# Patient Record
Sex: Male | Born: 1985 | Race: Black or African American | Hispanic: No | Marital: Single | Smoking: Never smoker
Health system: Southern US, Community
[De-identification: ages and names within clinical notes are randomized; demographics above are authoritative.]

## PROBLEM LIST (undated history)

## (undated) HISTORY — PX: WISDOM TOOTH EXTRACTION: SHX21

---

## 2011-07-28 ENCOUNTER — Ambulatory Visit (INDEPENDENT_AMBULATORY_CARE_PROVIDER_SITE_OTHER): Payer: BC Managed Care – PPO | Admitting: Physician Assistant

## 2011-07-28 VITALS — BP 108/67 | HR 70 | Temp 98.3°F | Resp 16 | Ht 69.0 in | Wt 238.4 lb

## 2011-07-28 DIAGNOSIS — M79609 Pain in unspecified limb: Secondary | ICD-10-CM

## 2011-07-28 DIAGNOSIS — B070 Plantar wart: Secondary | ICD-10-CM

## 2011-07-28 NOTE — Progress Notes (Signed)
  Subjective:    Patient ID: Steven Hancock, male    DOB: 01/29/1986, 26 y.o.   MRN: [id number]  HPI Steven Hancock comes in today c/o sore lesions on soles of bilateral feet for 1 month.  Lesions are spreading and are painful.  He works 3 jobs and is on his feet most of the day.  He has never had these lesions before and has not done anything to treat them now.      Review of Systems As above    Objective:   Physical Exam  Bilateral feet with multiple dark tender lesions scattered on soles of feet.  Thick callous on each lesion. No erythema.  No vesicles, pustules, drainage.  Several lesions prepped with alcohol and pared with a # 15 scalpel to reveal deeper "seeds"      Assessment & Plan:  Plantar Warts Pain foot  Home treatment reviewed. Purchase products with Salicylic Acid.  Recommend referral dermatology due to the extent of the lesions.

## 2011-07-28 NOTE — Patient Instructions (Signed)
Plantar Wart Warts are benign (noncancerous) growths of the outer skin layer. They can occur at any time in life but are most common during childhood and the teen years. Warts can occur on many skin surfaces of the body. When they occur on the underside (sole) of your foot they are called plantar warts. They often emerge in groups with several small warts encircling a larger growth. CAUSES  Human papillomavirus (HPV) is the cause of plantar warts. HPV attacks a break in the skin of the foot. Walking barefoot can lead to exposure to the wart virus. Plantar warts tend to develop over areas of pressure such as the heel and ball of the foot. Plantar warts often grow into the deeper layers of skin. They may spread to other areas of the sole but cannot spread to other areas of the body. SYMPTOMS  You may also notice a growth on the undersurface of your foot. The wart may grow directly into the sole of the foot, or rise above the surface of the skin on the sole of the foot, or both. They are most often flat from pressure. Warts generally do not cause itching but may cause pain in the area of the wart when you put weight on your foot. DIAGNOSIS  Diagnosis is made by physical examination. This means your caregiver discovers it while examining your foot.  TREATMENT  There are many ways to treat plantar warts. However, warts are very tough. Sometimes it is difficult to treat them so that they go away completely and do not grow back. Any treatment must be done regularly to work. If left untreated, most plantar warts will eventually disappear over a period of one to two years. Treatments you can do at home include:  Putting duct tape over the top of the wart (occlusion), has been found to be effective over several months. The duct tape should be removed each night and reapplied until the wart has disappeared.   Placing over-the-counter medications on top of the wart to help kill the wart virus and remove the wart  tissue (salicylic acid, cantharidin, and dichloroacetic acid ) are useful. These are called keratolytic agents. These medications make the skin soft and gradually layers will shed away. Theses compounds are usually placed on the wart each night and then covered with a band-aid. They are also available in pre-medicated band-aid form. Avoid surrounding skin when applying these liquids as these medications can burn healthy skin. The treatment may take several months of nightly use to be effective.   Cryotherapy to freeze the wart has recently become available over-the-counter for children 4 years and older. This system makes use of a soft narrow applicator connected to a bottle of compressed cold liquid that is applied directly to the wart. This medication can burn health skin and should be used with caution.   As with all over-the-counter medications, read the directions carefully before use.  Treatments generally done in your caregiver's office include:  Some aggressive treatments may cause discomfort, discoloration and scaring of the surrounding skin. The risks and benefits of treatment should be discussed with your caregiver.   Freezing the wart with liquid nitrogen (cryotherapy, see above).   Burning the wart with use of very high heat (cautery).   Injecting medication into the wart.   Surgically removing or laser treatment of the wart.   Your caregiver may refer you to a dermatologist for difficult to treat, large sized or large numbers of warts.  HOME CARE INSTRUCTIONS  Soak the affected area in warm water. Dry the area completely when you are done. Remove the top layer of softened skin, then apply the chosen topical medication and reapply a bandage.   Remove the bandage daily and file excess wart tissue (pumice stone works well for this purpose). Repeat the entire process daily or every other day for weeks until the plantar wart disappears.   Several brands of salicylic acid pads are  available as over-the-counter remedies.   Pain can be relieved by wearing a doughnut bandage. This is a bandage with a hole in it. The bandage is put on with the hole over the wart. This helps take the pressure off the wart and gives pain relief.  To help prevent plantar warts:  Wear shoes and socks and change them daily.   Keep feet clean and dry.   Check your feet and your children's feet regularly.   Avoid direct contact with warts on other people.   Have growths, or changes on your skin checked by your caregiver.  Document Released: 06/28/2003 Document Revised: 03/27/2011 Document Reviewed: 12/06/2008 [facility] Patient Information 2012 [place], [place].     Buy any over the counter product with Salicylic Acid for at home treatment. You can buy corn pads to help relieve pressure off warts. Dermatology appointment will be necessary.  You can call [facility], Dr. Jorja Loa, or Dr. Emily Filbert to make your own appointment.

## 2015-03-10 ENCOUNTER — Emergency Department (INDEPENDENT_AMBULATORY_CARE_PROVIDER_SITE_OTHER): Payer: BLUE CROSS/BLUE SHIELD

## 2015-03-10 ENCOUNTER — Emergency Department (HOSPITAL_COMMUNITY)
Admission: EM | Admit: 2015-03-10 | Discharge: 2015-03-10 | Disposition: A | Discharge status: Home / Self Care | Payer: BLUE CROSS/BLUE SHIELD | Source: Home / Self Care | Attending: Emergency Medicine | Admitting: Emergency Medicine

## 2015-03-10 ENCOUNTER — Encounter (HOSPITAL_COMMUNITY): Payer: Self-pay | Admitting: Emergency Medicine

## 2015-03-10 DIAGNOSIS — J40 Bronchitis, not specified as acute or chronic: Secondary | ICD-10-CM | POA: Diagnosis not present

## 2015-03-10 MED ORDER — PREDNISONE 50 MG PO TABS: ORAL_TABLET | ORAL | Status: DC

## 2015-03-10 MED ORDER — HYDROCODONE-HOMATROPINE 5-1.5 MG/5ML PO SYRP: 5.0000 mL | ORAL_SOLUTION | Freq: Four times a day (QID) | ORAL | Status: DC | PRN

## 2015-03-10 MED ORDER — AZITHROMYCIN 250 MG PO TABS: ORAL_TABLET | ORAL | Status: DC

## 2015-03-10 NOTE — ED Notes (Signed)
The [facility] with a complaint of a cough and congestion that has been ongoing for 1 month.

## 2015-03-10 NOTE — Discharge Instructions (Signed)
You have bronchitis. Take azithromycin and prednisone as prescribed. Use hycodan as needed for cough.  Do not take this medicine while driving. You should see improvement in the next 3-5 days. If you develop fevers, difficulty breathing, or are just not getting better, please come back or go to the emergency room.

## 2015-03-10 NOTE — ED Provider Notes (Signed)
CSN: [id number]     Arrival date & time 03/10/15  1725 History   First MD Initiated Contact with Patient 03/10/15 1800     Chief Complaint  Patient presents with  . Cough   (Consider location/radiation/quality/duration/timing/severity/associated sxs/prior Treatment) HPI  He is a 29 year old man here for evaluation of cough. He states he has had a productive cough for the last several weeks. It is associated with nasal congestion and rhinorrhea. He also reports a mild sore throat. He reports intermittently feeling short of breath. He reports slightly decreased stamina. He denies any fevers or chills. He states he had some chest pain initially, but this has resolved. His daughter had similar symptoms and was diagnosed with walking pneumonia earlier this week. He has been taking DayQuil and NyQuil with some improvement of symptoms.  History reviewed. No pertinent past medical history. History reviewed. No pertinent past surgical history. History reviewed. No pertinent family history. Social History  Substance Use Topics  . Smoking status: Never Smoker   . Smokeless tobacco: None  . Alcohol Use: Yes     Comment: not often    Review of Systems As in history of present illness Allergies  Review of patient's allergies indicates no known allergies.  Home Medications   Prior to Admission medications   Medication Sig Start Date End Date Taking? Authorizing Provider  azithromycin (ZITHROMAX Z-PAK) 250 MG tablet Take 2 pills today, then 1 pill daily until gone. 03/10/15   Charm RingsErin J Teon Hudnall, MD  HYDROcodone-homatropine (HYCODAN) 5-1.5 MG/5ML syrup Take 5 mLs by mouth every 6 (six) hours as needed for cough. 03/10/15   Charm RingsErin J Gracelee Stemmler, MD  predniSONE (DELTASONE) 50 MG tablet Take 1 pill daily for 5 days. 03/10/15   Charm RingsErin J Lennix Kneisel, MD   Meds Ordered and Administered this Visit  Medications - No data to display  BP 116/70 mmHg  Pulse 74  Temp(Src) 98.8 F (37.1 C) (Oral)  Resp 20  SpO2 98% No data  found.   Physical Exam  Constitutional: He is oriented to person, place, and time. He appears well-developed and well-nourished. No distress.  HENT:  Nose: Nose normal.  Mouth/Throat: No oropharyngeal exudate.  Mild postnasal drainage  Neck: Neck supple.  Cardiovascular: Normal rate, regular rhythm and normal heart sounds.   No murmur heard. Pulmonary/Chest: Effort normal and breath sounds normal. No respiratory distress. He has no wheezes. He has no rales.  Lymphadenopathy:    He has no cervical adenopathy.  Neurological: He is oriented to person, place, and time.    ED Course  Procedures (including critical care time)  Labs Review Labs Reviewed - No data to display  Imaging Review Dg Chest 2 View  03/10/2015  CLINICAL DATA:  Patient with cough for multiple weeks. EXAM: CHEST  2 VIEW COMPARISON:  None. FINDINGS: Normal cardiac and mediastinal contours. No consolidative pulmonary opacities. No pleural effusion or pneumothorax. Regional skeleton is unremarkable. IMPRESSION: No active cardiopulmonary disease. Electronically Signed   By: Annia Beltrew  Davis M.D.   On: 03/10/2015 18:56      MDM   1. Bronchitis    Treatment prednisone, azithromycin, and Hycodan. Return precautions reviewed.    Charm RingsErin J Caleigha Zale, MD 03/10/15 [phone]

## 2015-04-07 ENCOUNTER — Emergency Department (HOSPITAL_COMMUNITY)
Admission: EM | Admit: 2015-04-07 | Discharge: 2015-04-07 | Disposition: A | Discharge status: Home / Self Care | Payer: BLUE CROSS/BLUE SHIELD | Source: Home / Self Care | Attending: Emergency Medicine | Admitting: Emergency Medicine

## 2015-04-07 ENCOUNTER — Encounter (HOSPITAL_COMMUNITY): Payer: Self-pay | Admitting: Emergency Medicine

## 2015-04-07 DIAGNOSIS — J069 Acute upper respiratory infection, unspecified: Secondary | ICD-10-CM | POA: Diagnosis not present

## 2015-04-07 DIAGNOSIS — J9801 Acute bronchospasm: Secondary | ICD-10-CM | POA: Diagnosis not present

## 2015-04-07 MED ORDER — ALBUTEROL SULFATE HFA 108 (90 BASE) MCG/ACT IN AERS: 2.0000 | INHALATION_SPRAY | RESPIRATORY_TRACT | Status: DC | PRN

## 2015-04-07 MED ORDER — PREDNISONE 20 MG PO TABS: ORAL_TABLET | ORAL | Status: DC

## 2015-04-07 NOTE — Discharge Instructions (Signed)
Bronchospasm, Adult Albuterol inhaler 2 puffs every 4 hours as needed for cough and wheeze Prednisone taper dose, take with food. A bronchospasm is a spasm or tightening of the airways going into the lungs. During a bronchospasm breathing becomes more difficult because the airways get smaller. When this happens there can be coughing, a whistling sound when breathing (wheezing), and difficulty breathing. Bronchospasm is often associated with asthma, but not all patients who experience a bronchospasm have asthma. CAUSES  A bronchospasm is caused by inflammation or irritation of the airways. The inflammation or irritation may be triggered by:   Allergies (such as to animals, pollen, food, or mold). Allergens that cause bronchospasm may cause wheezing immediately after exposure or many hours later.   Infection. Viral infections are believed to be the most common cause of bronchospasm.   Exercise.   Irritants (such as pollution, cigarette smoke, strong odors, aerosol sprays, and paint fumes).   Weather changes. Winds increase molds and pollens in the air. Rain refreshes the air by washing irritants out. Cold air may cause inflammation.   Stress and emotional upset.  SIGNS AND SYMPTOMS   Wheezing.   Excessive nighttime coughing.   Frequent or severe coughing with a simple cold.   Chest tightness.   Shortness of breath.  DIAGNOSIS  Bronchospasm is usually diagnosed through a history and physical exam. Tests, such as chest X-rays, are sometimes done to look for other conditions. TREATMENT   Inhaled medicines can be given to open up your airways and help you breathe. The medicines can be given using either an inhaler or a nebulizer machine.  Corticosteroid medicines may be given for severe bronchospasm, usually when it is associated with asthma. HOME CARE INSTRUCTIONS   Always have a plan prepared for seeking medical care. Know when to call your health care provider and local  emergency services (911 in the [place] Know where you can access local emergency care.  Only take medicines as directed by your health care provider.  If you were prescribed an inhaler or nebulizer machine, ask your health care provider to explain how to use it correctly. Always use a spacer with your inhaler if you were given one.  It is necessary to remain calm during an attack. Try to relax and breathe more slowly.  Control your home environment in the following ways:   Change your heating and air conditioning filter at least once a month.   Limit your use of fireplaces and wood stoves.  Do not smoke and do not allow smoking in your home.   Avoid exposure to perfumes and fragrances.   Get rid of pests (such as roaches and mice) and their droppings.   Throw away plants if you see mold on them.   Keep your house clean and dust free.   Replace carpet with wood, tile, or vinyl flooring. Carpet can trap dander and dust.   Use allergy-proof pillows, mattress covers, and box spring covers.   Wash bed sheets and blankets every week in hot water and dry them in a dryer.   Use blankets that are made of polyester or cotton.   Wash hands frequently. SEEK MEDICAL CARE IF:   You have muscle aches.   You have chest pain.   The sputum changes from clear or white to yellow, green, gray, or bloody.   The sputum you cough up gets thicker.   There are problems that may be related to the medicine you are given, such as a rash,  itching, swelling, or trouble breathing.  SEEK IMMEDIATE MEDICAL CARE IF:   You have worsening wheezing and coughing even after taking your prescribed medicines.   You have increased difficulty breathing.   You develop severe chest pain. MAKE SURE YOU:   Understand these instructions.  Will watch your condition.  Will get help right away if you are not doing well or get worse.   This information is not intended to replace advice given  to you by your health care provider. Make sure you discuss any questions you have with your health care provider.   Document Released: 04/10/2003 Document Revised: 04/28/2014 Document Reviewed: 09/27/2012 Elsevier Interactive Patient Education 2016 [employer].  How to Use an Inhaler Using your inhaler correctly is very important. Good technique will make sure that the medicine reaches your lungs.  HOW TO USE AN INHALER:  Take the cap off the inhaler.  If this is the first time using your inhaler, you need to prime it. Shake the inhaler for 5 seconds. Release four puffs into the air, away from your face. Ask your doctor for help if you have questions.  Shake the inhaler for 5 seconds.  Turn the inhaler so the bottle is above the mouthpiece.  Put your pointer finger on top of the bottle. Your thumb holds the bottom of the inhaler.  Open your mouth.  Either hold the inhaler away from your mouth (the width of 2 fingers) or place your lips tightly around the mouthpiece. Ask your doctor which way to use your inhaler.  Breathe out as much air as possible.  Breathe in and push down on the bottle 1 time to release the medicine. You will feel the medicine go in your mouth and throat.  Continue to take a deep breath in very slowly. Try to fill your lungs.  After you have breathed in completely, hold your breath for 10 seconds. This will help the medicine to settle in your lungs. If you cannot hold your breath for 10 seconds, hold it for as long as you can before you breathe out.  Breathe out slowly, through pursed lips. Whistling is an example of pursed lips.  If your doctor has told you to take more than 1 puff, wait at least 15-30 seconds between puffs. This will help you get the best results from your medicine. Do not use the inhaler more than your doctor tells you to.  Put the cap back on the inhaler.  Follow the directions from your doctor or from the inhaler package about cleaning  the inhaler. If you use more than one inhaler, ask your doctor which inhalers to use and what order to use them in. Ask your doctor to help you figure out when you will need to refill your inhaler.  If you use a steroid inhaler, always rinse your mouth with water after your last puff, gargle and spit out the water. Do not swallow the water. GET HELP IF:  The inhaler medicine only partially helps to stop wheezing or shortness of breath.  You are having trouble using your inhaler.  You have some increase in thick spit (phlegm). GET HELP RIGHT AWAY IF:  The inhaler medicine does not help your wheezing or shortness of breath or you have tightness in your chest.  You have dizziness, headaches, or fast heart rate.  You have chills, fever, or night sweats.  You have a large increase of thick spit, or your thick spit is bloody. MAKE SURE YOU:  Understand these instructions.  Will watch your condition.  Will get help right away if you are not doing well or get worse.   This information is not intended to replace advice given to you by your health care provider. Make sure you discuss any questions you have with your health care provider.   Document Released: 01/15/2008 Document Revised: 01/26/2013 Document Reviewed: 11/04/2012 Elsevier Interactive Patient Education 2016 [employer] Inc[employer]  Upper Respiratory Infection, Adult Recommend taking Allegra or Zyrtec or Claritin for nasal drainage. Most upper respiratory infections (URIs) are a viral infection of the air passages leading to the lungs. A URI affects the nose, throat, and upper air passages. The most common type of URI is nasopharyngitis and is typically referred to as "the common cold." URIs run their course and usually go away on their own. Most of the time, a URI does not require medical attention, but sometimes a bacterial infection in the upper airways can follow a viral infection. This is called a secondary infection. Sinus and  middle ear infections are common types of secondary upper respiratory infections. Bacterial pneumonia can also complicate a URI. A URI can worsen asthma and chronic obstructive pulmonary disease (COPD). Sometimes, these complications can require emergency medical care and may be life threatening.  CAUSES Almost all URIs are caused by viruses. A virus is a type of germ and can spread from one person to another.  RISKS FACTORS You may be at risk for a URI if:   You smoke.   You have chronic heart or lung disease.  You have a weakened defense (immune) system.   You are very young or very old.   You have nasal allergies or asthma.  You work in crowded or poorly ventilated areas.  You work in health care facilities or schools. SIGNS AND SYMPTOMS  Symptoms typically develop 2-3 days after you come in contact with a cold virus. Most viral URIs last 7-10 days. However, viral URIs from the influenza virus (flu virus) can last 14-18 days and are typically more severe. Symptoms may include:   Runny or stuffy (congested) nose.   Sneezing.   Cough.   Sore throat.   Headache.   Fatigue.   Fever.   Loss of appetite.   Pain in your forehead, behind your eyes, and over your cheekbones (sinus pain).  Muscle aches.  DIAGNOSIS  Your health care provider may diagnose a URI by:  Physical exam.  Tests to check that your symptoms are not due to another condition such as:  Strep throat.  Sinusitis.  Pneumonia.  Asthma. TREATMENT  A URI goes away on its own with time. It cannot be cured with medicines, but medicines may be prescribed or recommended to relieve symptoms. Medicines may help:  Reduce your fever.  Reduce your cough.  Relieve nasal congestion. HOME CARE INSTRUCTIONS   Take medicines only as directed by your health care provider.   Gargle warm saltwater or take cough drops to comfort your throat as directed by your health care provider.  Use a warm  mist humidifier or inhale steam from a shower to increase air moisture. This may make it easier to breathe.  Drink enough fluid to keep your urine clear or pale yellow.   Eat soups and other clear broths and maintain good nutrition.   Rest as needed.   Return to work when your temperature has returned to normal or as your health care provider advises. You may need to stay home longer to  avoid infecting others. You can also use a face mask and careful hand washing to prevent spread of the virus.  Increase the usage of your inhaler if you have asthma.   Do not use any tobacco products, including cigarettes, chewing tobacco, or electronic cigarettes. If you need help quitting, ask your health care provider. PREVENTION  The best way to protect yourself from getting a cold is to practice good hygiene.   Avoid oral or hand contact with people with cold symptoms.   Wash your hands often if contact occurs.  There is no clear evidence that vitamin C, vitamin E, echinacea, or exercise reduces the chance of developing a cold. However, it is always recommended to get plenty of rest, exercise, and practice good nutrition.  SEEK MEDICAL CARE IF:   You are getting worse rather than better.   Your symptoms are not controlled by medicine.   You have chills.  You have worsening shortness of breath.  You have brown or red mucus.  You have yellow or brown nasal discharge.  You have pain in your face, especially when you bend forward.  You have a fever.  You have swollen neck glands.  You have pain while swallowing.  You have white areas in the back of your throat. SEEK IMMEDIATE MEDICAL CARE IF:   You have severe or persistent:  Headache.  Ear pain.  Sinus pain.  Chest pain.  You have chronic lung disease and any of the following:  Wheezing.  Prolonged cough.  Coughing up blood.  A change in your usual mucus.  You have a stiff neck.  You have changes in  your:  Vision.  Hearing.  Thinking.  Mood. MAKE SURE YOU:   Understand these instructions.  Will watch your condition.  Will get help right away if you are not doing well or get worse.   This information is not intended to replace advice given to you by your health care provider. Make sure you discuss any questions you have with your health care provider.   Document Released: 10/01/2000 Document Revised: 08/22/2014 Document Reviewed: 07/13/2013 Elsevier Interactive Patient Education [employer] Elsevier Inc.

## 2015-04-07 NOTE — ED Provider Notes (Signed)
CSN: [id number]     Arrival date & time 04/07/15  1722 History   First MD Initiated Contact with Patient 04/07/15 1742     Chief Complaint  Patient presents with  . URI   (Consider location/radiation/quality/duration/timing/severity/associated sxs/prior Treatment) HPI Comments: 29 year old male works for [employer] is planing of head and chest congestion, mild shortness of breath, minor sore throat and PND. Denies fever but sometimes when working hard he feels hot.   History reviewed. No pertinent past medical history. History reviewed. No pertinent past surgical history. No family history on file. Social History  Substance Use Topics  . Smoking status: Never Smoker   . Smokeless tobacco: None  . Alcohol Use: Yes     Comment: not often    Review of Systems  Constitutional: Positive for activity change. Negative for fever, diaphoresis and fatigue.  HENT: Positive for congestion, postnasal drip and sore throat. Negative for ear pain, facial swelling, rhinorrhea and trouble swallowing.   Eyes: Negative for pain, discharge and redness.  Respiratory: Positive for cough and shortness of breath. Negative for chest tightness.   Cardiovascular: Negative.   Gastrointestinal: Negative.   Musculoskeletal: Negative.  Negative for neck pain and neck stiffness.  Neurological: Negative.     Allergies  Review of patient's allergies indicates no known allergies.  Home Medications   Prior to Admission medications   Medication Sig Start Date End Date Taking? Authorizing Provider  albuterol (PROVENTIL HFA;VENTOLIN HFA) 108 (90 BASE) MCG/ACT inhaler Inhale 2 puffs into the lungs every 4 (four) hours as needed for wheezing or shortness of breath. 04/07/15   Hayden Rasmussenavid Stavroula Rohde, NP  azithromycin (ZITHROMAX Z-PAK) 250 MG tablet Take 2 pills today, then 1 pill daily until gone. 03/10/15   Charm RingsErin J Honig, MD  HYDROcodone-homatropine (HYCODAN) 5-1.5 MG/5ML syrup Take 5 mLs by mouth every 6 (six) hours as needed for  cough. 03/10/15   Charm RingsErin J Honig, MD  predniSONE (DELTASONE) 20 MG tablet Take 3 tabs po on first day, 2 tabs second day, 2 tabs third day, 1 tab fourth day, 1 tab 5th day. Take with food. 04/07/15   Hayden Rasmussenavid Ingra Rother, NP   Meds Ordered and Administered this Visit  Medications - No data to display  BP 127/80 mmHg  Pulse 92  Temp(Src) 99.3 F (37.4 C) (Oral)  Resp 18  SpO2 96% No data found.   Physical Exam  Constitutional: He appears well-developed and well-nourished. No distress.  HENT:  Mouth/Throat: No oropharyngeal exudate.  Bilateral TMs benign observed due to cerumen obstruction. Oropharynx with minor erythema and scant clear PND. No exudates or swelling.  Eyes: Conjunctivae and EOM are normal.  Neck: Normal range of motion. Neck supple.  Cardiovascular: Normal rate, regular rhythm and normal heart sounds.   Pulmonary/Chest: Effort normal. He has wheezes. He has no rales.  Inspiration normal. Expiration with scattered bilateral wheezing. No crackles. Good air movement. Forced cough elicits mild coarseness bilaterally.  Musculoskeletal: He exhibits no edema.  Lymphadenopathy:    He has no cervical adenopathy.  Neurological: He is alert. He exhibits normal muscle tone.  Skin: Skin is warm and dry. No rash noted.  Nursing note and vitals reviewed.   ED Course  Procedures (including critical care time)  Labs Review Labs Reviewed - No data to display  Imaging Review No results found.   Visual Acuity Review  Right Eye Distance:   Left Eye Distance:   Bilateral Distance:    Right Eye Near:   Left Eye Near:  Bilateral Near:         MDM   1. URI (upper respiratory infection)   2. Cough due to bronchospasm    Recommend taking Allegra or Zyrtec or Claritin for nasal drainage. Albuterol as dir Prednisone taper as dir.  Tylenol prn    Hayden Rasmussen, NP 04/07/15 [phone]

## 2015-04-07 NOTE — ED Notes (Signed)
C/o cold sx onset today Sx include: SOB, wheezing, runny nose, congestion, prod cough Denies fevers A&O x4... No acute distress.

## 2017-08-29 IMAGING — DX DG CHEST 2V
2 series · 2 of 2 positions shown · non-contrast
Comparison: None.

CLINICAL DATA: Patient with cough for multiple weeks.

EXAM:
CHEST  2 VIEW

[chest pa]
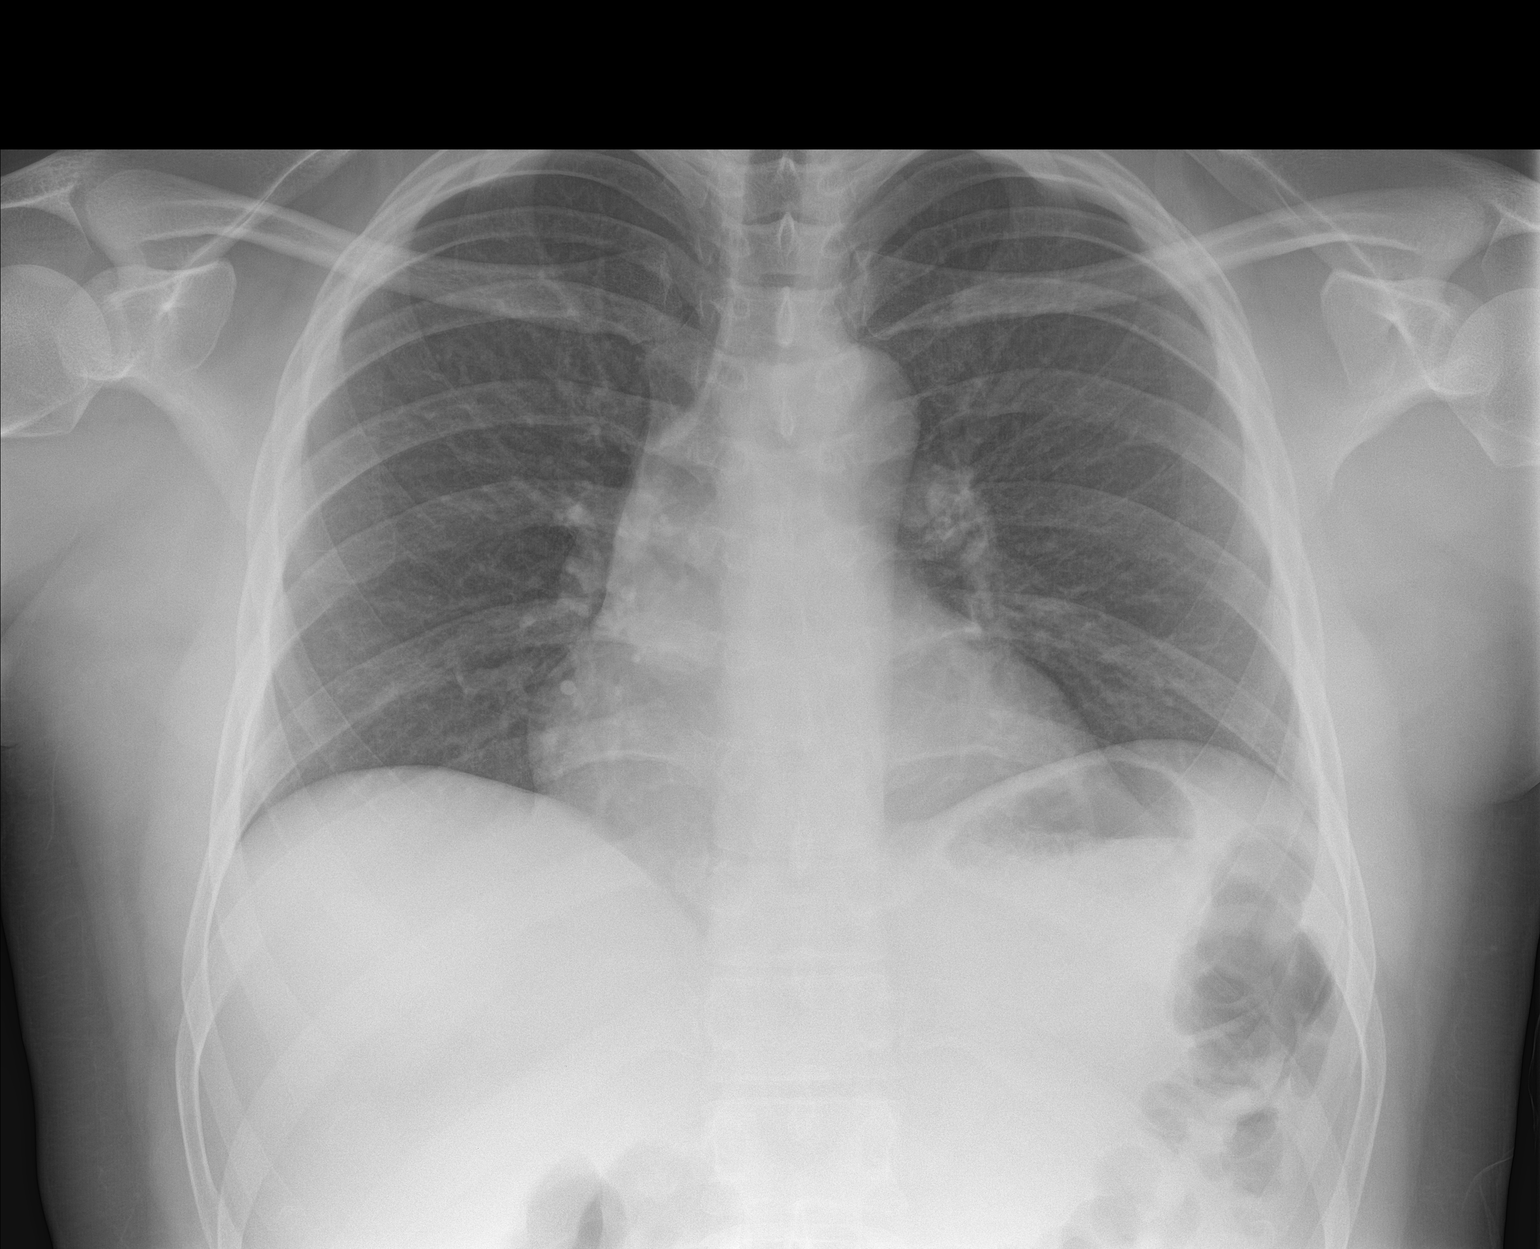

[chest lat]
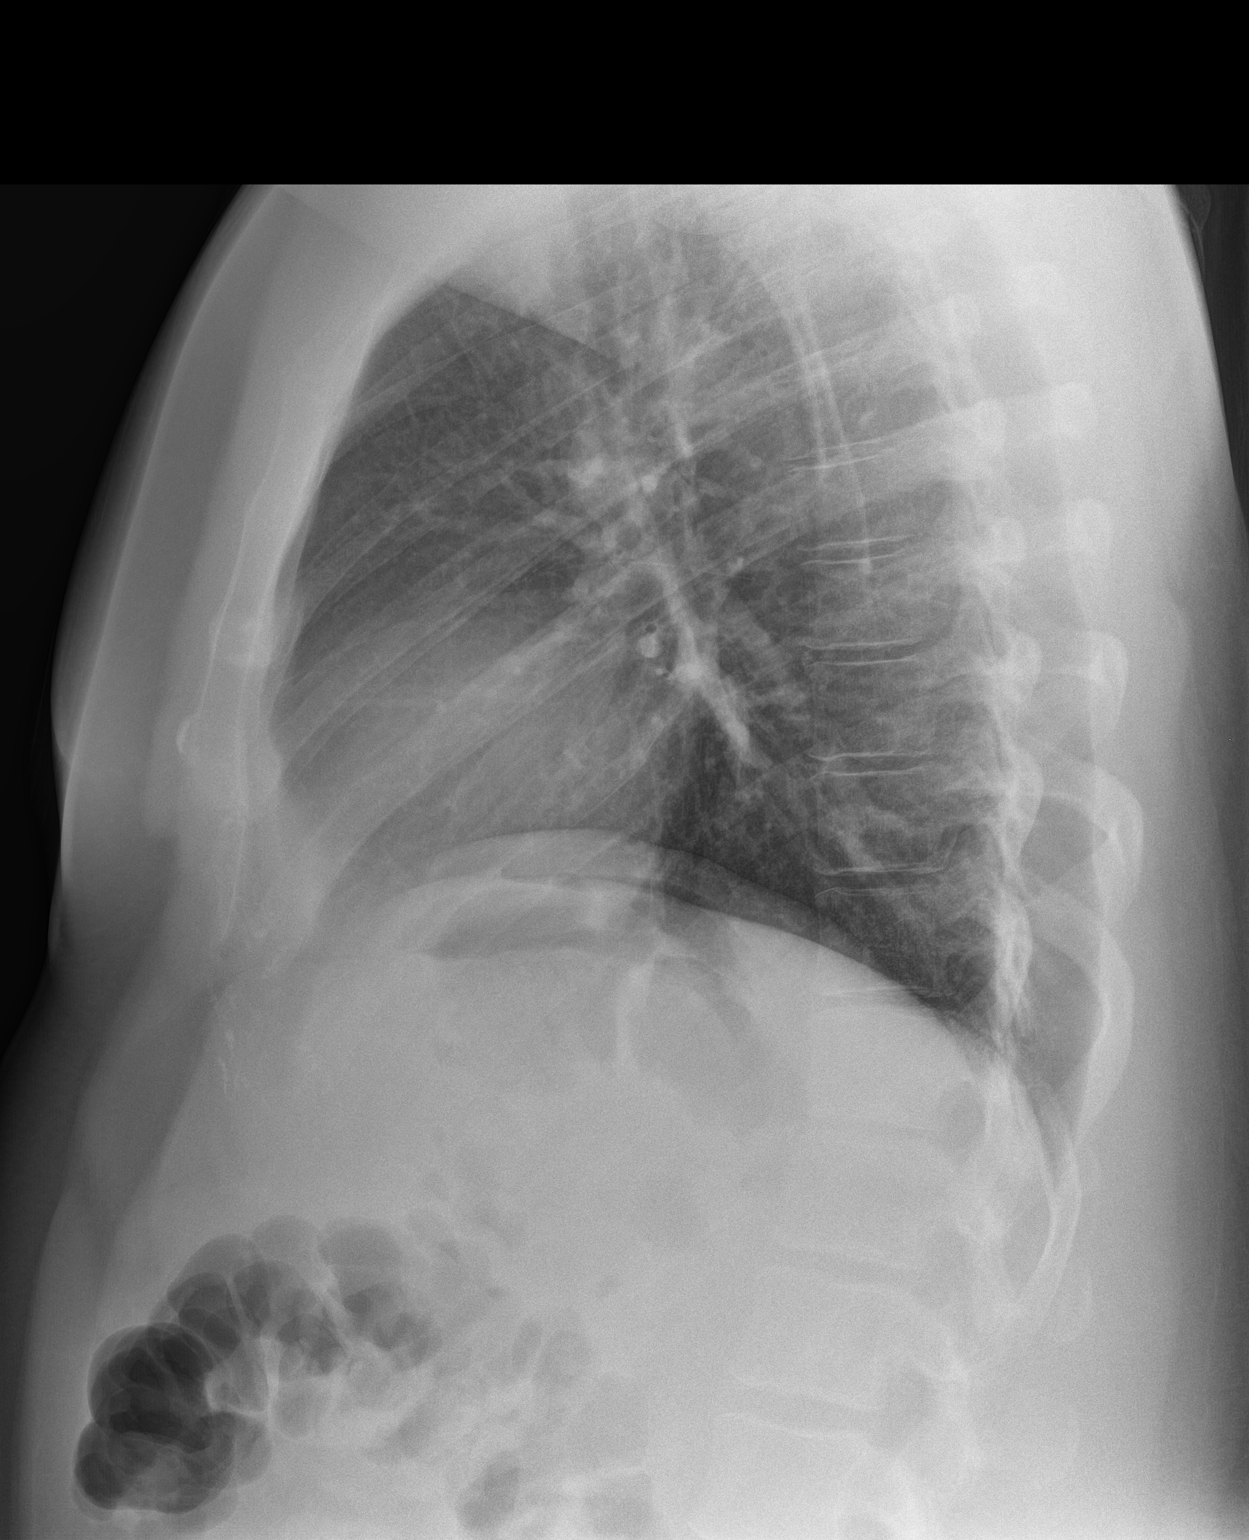

[2 of 2 positions shown; findings below may reference images not displayed]

FINDINGS: Normal cardiac and mediastinal contours. No consolidative pulmonary
opacities. No pleural effusion or pneumothorax. Regional skeleton is
unremarkable.
IMPRESSION: No active cardiopulmonary disease.

## 2019-11-14 ENCOUNTER — Encounter: Payer: Self-pay | Admitting: Nurse Practitioner

## 2019-11-14 ENCOUNTER — Other Ambulatory Visit: Payer: Self-pay

## 2019-11-14 ENCOUNTER — Ambulatory Visit (INDEPENDENT_AMBULATORY_CARE_PROVIDER_SITE_OTHER): Payer: BC Managed Care – PPO | Admitting: Nurse Practitioner

## 2019-11-14 VITALS — BP 124/81 | HR 85 | Temp 98.5°F | Resp 16 | Ht 69.0 in | Wt 229.0 lb

## 2019-11-14 DIAGNOSIS — E669 Obesity, unspecified: Secondary | ICD-10-CM

## 2019-11-14 DIAGNOSIS — J4520 Mild intermittent asthma, uncomplicated: Secondary | ICD-10-CM

## 2019-11-14 MED ORDER — ALBUTEROL SULFATE HFA 108 (90 BASE) MCG/ACT IN AERS: 2.0000 | INHALATION_SPRAY | Freq: Four times a day (QID) | RESPIRATORY_TRACT | 2 refills | Status: DC | PRN

## 2019-11-14 NOTE — Patient Instructions (Signed)
Earwax Buildup, Adult The ears produce a substance called earwax that helps keep bacteria out of the ear and protects the skin in the ear canal. Occasionally, earwax can build up in the ear and cause discomfort or hearing loss. What increases the risk? This condition is more likely to develop in people who:  Are male.  Are elderly.  Naturally produce more earwax.  Clean their ears often with cotton swabs.  Use earplugs often.  Use in-ear headphones often.  Wear hearing aids.  Have narrow ear canals.  Have earwax that is overly thick or sticky.  Have eczema.  Are dehydrated.  Have excess hair in the ear canal. What are the signs or symptoms? Symptoms of this condition include:  Reduced or muffled hearing.  A feeling of fullness in the ear or feeling that the ear is plugged.  Fluid coming from the ear.  Ear pain.  Ear itch.  Ringing in the ear.  Coughing.  An obvious piece of earwax that can be seen inside the ear canal. How is this diagnosed? This condition may be diagnosed based on:  Your symptoms.  Your medical history.  An ear exam. During the exam, your health care provider will look into your ear with an instrument called an otoscope. You may have tests, including a hearing test. How is this treated? This condition may be treated by:  Using ear drops to soften the earwax.  Having the earwax removed by a health care provider. The health care provider may: ? Flush the ear with water. ? Use an instrument that has a loop on the end (curette). ? Use a suction device.  Surgery to remove the wax buildup. This may be done in severe cases. Follow these instructions at home:   Take over-the-counter and prescription medicines only as told by your health care provider.  Do not put any objects, including cotton swabs, into your ear. You can clean the opening of your ear canal with a washcloth or facial tissue.  Follow instructions from your health care  provider about cleaning your ears. Do not over-clean your ears.  Drink enough fluid to keep your urine clear or pale yellow. This will help to thin the earwax.  Keep all follow-up visits as told by your health care provider. If earwax builds up in your ears often or if you use hearing aids, consider seeing your health care provider for routine, preventive ear cleanings. Ask your health care provider how often you should schedule your cleanings.  If you have hearing aids, clean them according to instructions from the manufacturer and your health care provider. Contact a health care provider if:  You have ear pain.  You develop a fever.  You have blood, pus, or other fluid coming from your ear.  You have hearing loss.  You have ringing in your ears that does not go away.  Your symptoms do not improve with treatment.  You feel like the room is spinning (vertigo). Summary  Earwax can build up in the ear and cause discomfort or hearing loss.  The most common symptoms of this condition include reduced or muffled hearing and a feeling of fullness in the ear or feeling that the ear is plugged.  This condition may be diagnosed based on your symptoms, your medical history, and an ear exam.  This condition may be treated by using ear drops to soften the earwax or by having the earwax removed by a health care provider.  Do not put any   objects, including cotton swabs, into your ear. You can clean the opening of your ear canal with a washcloth or facial tissue. This information is not intended to replace advice given to you by your health care provider. Make sure you discuss any questions you have with your health care provider. Document Revised: 03/20/2017 Document Reviewed: 06/18/2016 Elsevier Patient Education  2020 Elsevier Inc.  

## 2019-11-14 NOTE — Progress Notes (Signed)
[facility] [address] [place], [place]  [address] Phone:  [phone]   Fax:  [phone]    New Patient Office Visit  Subjective:  Patient ID: Steven Hancock, male    DOB: 07-16-1985  Age: 34 y.o. MRN: [id number]  CC:  Chief Complaint  Patient presents with  . Establish Care    general check up     HPI  Steven Hancock presents to establish care.  He has a remote history of asthma.  He admits that he also receives allergy shots as a child.  He is currently working full-time at [employer] and does part-time work. Current symptoms include wheezing. Symptoms have been present since several months ago and have been stable. He denies chest pain, chest tightness, dyspnea and productive cough. Associated symptoms include none.  This episode appears to have been triggered by dust. No treatments has been tried for the current symptoms.  The patient has been having similar episodes for approximately As a child  History reviewed. No pertinent past medical history.  Past Surgical History:  Procedure Laterality Date  . WISDOM TOOTH EXTRACTION      Family History  Problem Relation Age of Onset  . Hypertension Mother   . Heart disease Father   . Hypertension Maternal Grandfather   . Heart disease Maternal Grandfather     Social History   Socioeconomic History  . Marital status: Single    Spouse name: Not on file  . Number of children: Not on file  . Years of education: Not on file  . Highest education level: Not on file  Occupational History  . Not on file  Tobacco Use  . Smoking status: Never Smoker  . Smokeless tobacco: Never Used  Vaping Use  . Vaping Use: Never used  Substance and Sexual Activity  . Alcohol use: Yes    Comment: not often  . Drug use: Yes    Types: Marijuana    Comment: daily  . Sexual activity: Yes  Other Topics Concern  . Not on file  Social History Narrative  . Not on file   Social Determinants of Health   Financial  Resource Strain:   . Difficulty of Paying Living Expenses:   Food Insecurity:   . Worried About Programme researcher, broadcasting/film/video in the Last Year:   . Barista in the Last Year:   Transportation Needs:   . Freight forwarder (Medical):   Marland Kitchen Lack of Transportation (Non-Medical):   Physical Activity:   . Days of Exercise per Week:   . Minutes of Exercise per Session:   Stress:   . Feeling of Stress :   Social Connections:   . Frequency of Communication with Friends and Family:   . Frequency of Social Gatherings with Friends and Family:   . Attends Religious Services:   . Active Member of Clubs or Organizations:   . Attends Banker Meetings:   Marland Kitchen Marital Status:   Intimate Partner Violence:   . Fear of Current or Ex-Partner:   . Emotionally Abused:   Marland Kitchen Physically Abused:   . Sexually Abused:     ROS Review of Systems  Constitutional: Negative for unexpected weight change.       Changed diet Busy at [employer]   HENT: Negative.   Eyes: Negative.   Respiratory: Positive for wheezing.   Gastrointestinal: Negative.   Endocrine: Negative.   Genitourinary: Negative.   Musculoskeletal: Negative.   Skin:  Negative.   Allergic/Immunologic: Negative.   Neurological: Negative.   Hematological: Negative.   Psychiatric/Behavioral: Negative.   All other systems reviewed and are negative.   Objective:   Today's Vitals: BP 124/81 (BP Location: Left Arm, Patient Position: Sitting, Cuff Size: Large)   Pulse 85   Temp 98.5 F (36.9 C) (Oral)   Resp 16   Ht 5\' 9"  (1.753 m)   Wt (!) 229 lb (103.9 kg)   SpO2 100%   BMI 33.82 kg/m   Physical Exam Vitals reviewed.  Constitutional:      Appearance: He is obese.  HENT:     Head: Normocephalic and atraumatic.     Right Ear: There is impacted cerumen.     Left Ear: There is impacted cerumen.     Nose: Nose normal.     Mouth/Throat:     Mouth: Mucous membranes are moist.  Eyes:     Pupils: Pupils are equal, round, and  reactive to light.  Cardiovascular:     Rate and Rhythm: Normal rate and regular rhythm.     Pulses: Normal pulses.     Heart sounds: Normal heart sounds.  Pulmonary:     Effort: Pulmonary effort is normal.  Abdominal:     General: Bowel sounds are normal.     Palpations: Abdomen is soft.  Musculoskeletal:        General: Normal range of motion.     Cervical back: Normal range of motion.  Skin:    General: Skin is warm and dry.     Capillary Refill: Capillary refill takes less than 2 seconds.  Neurological:     General: No focal deficit present.     Mental Status: He is alert and oriented to person, place, and time.  Psychiatric:        Mood and Affect: Mood normal.        Behavior: Behavior normal.        Thought Content: Thought content normal.        Judgment: Judgment normal.     Assessment & Plan:   Problem List Items Addressed This Visit    None    Visit Diagnoses    Mild intermittent asthma, unspecified whether complicated    -  Primary   Relevant Medications Use while working however encouraged him follow up if he is having to use it more than 3 days   albuterol (VENTOLIN HFA) 108 (90 Base) MCG/ACT inhaler   Obesity (BMI 30.0-34.9)     Encouraged to continue lifestyle  Modification and to add planned 30 minutes daily      Outpatient Encounter Medications as of 11/14/2019  Medication Sig  . Multiple Vitamin (MULTIVITAMIN) tablet Take 1 tablet by mouth daily.  11/16/2019 albuterol (VENTOLIN HFA) 108 (90 Base) MCG/ACT inhaler Inhale 2 puffs into the lungs every 6 (six) hours as needed for wheezing or shortness of breath.  . [DISCONTINUED] albuterol (PROVENTIL HFA;VENTOLIN HFA) 108 (90 BASE) MCG/ACT inhaler Inhale 2 puffs into the lungs every 4 (four) hours as needed for wheezing or shortness of breath.  . [DISCONTINUED] azithromycin (ZITHROMAX Z-PAK) 250 MG tablet Take 2 pills today, then 1 pill daily until gone.  . [DISCONTINUED] HYDROcodone-homatropine (HYCODAN) 5-1.5  MG/5ML syrup Take 5 mLs by mouth every 6 (six) hours as needed for cough.  . [DISCONTINUED] predniSONE (DELTASONE) 20 MG tablet Take 3 tabs po on first day, 2 tabs second day, 2 tabs third day, 1 tab fourth day, 1 tab 5th day.  Take with food.   No facility-administered encounter medications on file as of 11/14/2019.    Follow-up: Return in about 6 months (around 05/16/2020).   Barbette Merino, NP

## 2020-05-16 ENCOUNTER — Encounter: Payer: Self-pay | Admitting: Nurse Practitioner

## 2020-05-16 ENCOUNTER — Other Ambulatory Visit: Payer: Self-pay

## 2020-05-16 ENCOUNTER — Ambulatory Visit (INDEPENDENT_AMBULATORY_CARE_PROVIDER_SITE_OTHER): Payer: BC Managed Care – PPO | Admitting: Nurse Practitioner

## 2020-05-16 VITALS — BP 130/80 | HR 84 | Temp 98.4°F | Ht 69.0 in | Wt 217.8 lb

## 2020-05-16 DIAGNOSIS — Z1159 Encounter for screening for other viral diseases: Secondary | ICD-10-CM | POA: Diagnosis not present

## 2020-05-16 DIAGNOSIS — Z13220 Encounter for screening for lipoid disorders: Secondary | ICD-10-CM

## 2020-05-16 DIAGNOSIS — E669 Obesity, unspecified: Secondary | ICD-10-CM | POA: Diagnosis not present

## 2020-05-16 DIAGNOSIS — J4520 Mild intermittent asthma, uncomplicated: Secondary | ICD-10-CM

## 2020-05-16 DIAGNOSIS — Z114 Encounter for screening for human immunodeficiency virus [HIV]: Secondary | ICD-10-CM

## 2020-05-16 NOTE — Patient Instructions (Signed)
Debrox

## 2020-05-16 NOTE — Progress Notes (Unsigned)
[facility] [address] [place], [place]  [address] Phone:  [phone]   Fax:  [phone]   Established Patient Office Visit  Subjective:  Patient ID: Steven Hancock, male    DOB: [birth year]  Age: 35 y.o. MRN: [id number]  CC:  Chief Complaint  Patient presents with  . Follow-up    6 follow up, think pull a muscle in rib cage , taking medication and it help with pain     HPI Sundiata Fortin presents for follow up. He  has no past medical history on file.   He has a history of asthma. He admits that his symptoms have been controlled.   He has only one concern that he had some rib pain on last Thursday. He feels like this was related to his sleeping position. He likes to lay on his stomach. He felt like he slept on his elbows and this my be the cause of the pain. He feels like this is improving. He denies any injuries, shortness of breath or chest pain.   History reviewed. No pertinent past medical history.  Past Surgical History:  Procedure Laterality Date  . WISDOM TOOTH EXTRACTION      Family History  Problem Relation Age of Onset  . Hypertension Mother   . Heart disease Father   . Hypertension Maternal Grandfather   . Heart disease Maternal Grandfather     Social History   Socioeconomic History  . Marital status: Single    Spouse name: Not on file  . Number of children: Not on file  . Years of education: Not on file  . Highest education level: Not on file  Occupational History  . Not on file  Tobacco Use  . Smoking status: Never Smoker  . Smokeless tobacco: Never Used  Vaping Use  . Vaping Use: Never used  Substance and Sexual Activity  . Alcohol use: Yes    Comment: not often  . Drug use: Yes    Types: Marijuana    Comment: daily  . Sexual activity: Yes  Other Topics Concern  . Not on file  Social History Narrative  . Not on file   Social Determinants of Health   Financial Resource Strain: Not on file  Food Insecurity:  Not on file  Transportation Needs: Not on file  Physical Activity: Not on file  Stress: Not on file  Social Connections: Not on file  Intimate Partner Violence: Not on file    Outpatient Medications Prior to Visit  Medication Sig Dispense Refill  . Ibuprofen (ADVIL LIQUI-GELS MINIS PO) Take by mouth.    . Multiple Vitamin (MULTIVITAMIN) tablet Take 1 tablet by mouth daily.    Marland Kitchen albuterol (VENTOLIN HFA) 108 (90 Base) MCG/ACT inhaler Inhale 2 puffs into the lungs every 6 (six) hours as needed for wheezing or shortness of breath. (Patient not taking: Reported on 05/16/2020) 8.5 g 2   No facility-administered medications prior to visit.    No Known Allergies  ROS Review of Systems    Objective:    Physical Exam HENT:     Head: Normocephalic and atraumatic.     Right Ear: There is impacted cerumen.     Left Ear: There is impacted cerumen.     Nose: Nose normal.     Mouth/Throat:     Mouth: Mucous membranes are moist.  Cardiovascular:     Rate and Rhythm: Normal rate and regular rhythm.     Pulses: Normal pulses.  Heart sounds: Normal heart sounds.  Pulmonary:     Effort: Pulmonary effort is normal.     Breath sounds: Normal breath sounds.  Abdominal:     General: Bowel sounds are normal.     Palpations: Abdomen is soft.  Musculoskeletal:        General: Normal range of motion.     Cervical back: Normal range of motion.  Skin:    General: Skin is warm and dry.     Capillary Refill: Capillary refill takes less than 2 seconds.  Neurological:     General: No focal deficit present.     Mental Status: He is alert and oriented to person, place, and time.  Psychiatric:        Mood and Affect: Mood normal.        Behavior: Behavior normal.        Thought Content: Thought content normal.        Judgment: Judgment normal.     BP 130/80 (BP Location: Left Arm, Patient Position: Sitting, Cuff Size: Normal)   Pulse 84   Temp 98.4 F (36.9 C) (Temporal)   Ht 5\' 9"  (1.753  m)   Wt 217 lb 12.8 oz (98.8 kg)   SpO2 96%   BMI 32.16 kg/m  Wt Readings from Last 3 Encounters:  05/16/20 217 lb 12.8 oz (98.8 kg)  11/14/19 (!) 229 lb (103.9 kg)  07/28/11 238 lb 6.4 oz (108.1 kg)     There are no preventive care reminders to display for this patient.  There are no preventive care reminders to display for this patient.  No results found for: TSH No results found for: WBC, HGB, HCT, MCV, PLT Lab Results  Component Value Date   NA 143 05/16/2020   K 4.8 05/16/2020   GLUCOSE 101 (H) 05/16/2020   BUN 15 05/16/2020   CREATININE 1.02 05/16/2020   BILITOT 0.8 05/16/2020   ALKPHOS 89 05/16/2020   AST 26 05/16/2020   PROT 6.5 05/16/2020   ALBUMIN 4.3 05/16/2020   CALCIUM 9.8 05/16/2020   Lab Results  Component Value Date   CHOL 174 05/16/2020   Lab Results  Component Value Date   HDL 66 05/16/2020   Lab Results  Component Value Date   LDLCALC 95 05/16/2020   Lab Results  Component Value Date   TRIG 68 05/16/2020   Lab Results  Component Value Date   CHOLHDL 2.6 05/16/2020   No results found for: HGBA1C    Assessment & Plan:   Problem List Items Addressed This Visit   None   Visit Diagnoses    Mild intermittent asthma, unspecified whether complicated    -  Primary Stable will continue with current regimen Albuterol 108 mcg 2 puffs q 6 hours prn   Obesity (BMI 30.0-34.9)     Improving Continue with lifestyle modifications with diet and exercise and weight loss.     Relevant Orders   Comp. Metabolic Panel (12) (Completed)   Encounter for hepatitis C screening test for low risk patient       Relevant Orders   Hepatitis C antibody (Completed)   Screening for HIV (human immunodeficiency virus)       Relevant Orders   HIV Antibody (routine testing w rflx) (Completed)   Screening for cholesterol level       Relevant Orders   Lipid panel (Completed)      No orders of the defined types were placed in this encounter.   Follow-up:  Return in about 1 year (around 05/16/2021).    Barbette Merino, NP

## 2020-05-17 LAB — COMP. METABOLIC PANEL (12)
AST: 26 IU/L (ref 0–40)
Albumin/Globulin Ratio: 2 (ref 1.2–2.2)
Albumin: 4.3 g/dL (ref 4.0–5.0)
Alkaline Phosphatase: 89 IU/L (ref 44–121)
BUN/Creatinine Ratio: 15 (ref 9–20)
BUN: 15 mg/dL (ref 6–20)
Bilirubin Total: 0.8 mg/dL (ref 0.0–1.2)
Calcium: 9.8 mg/dL (ref 8.7–10.2)
Chloride: 104 mmol/L (ref 96–106)
Creatinine, Ser: 1.02 mg/dL (ref 0.76–1.27)
GFR calc Af Amer: 110 mL/min/{1.73_m2} (ref >59)
GFR calc non Af Amer: 95 mL/min/{1.73_m2} (ref >59)
Globulin, Total: 2.2 g/dL (ref 1.5–4.5)
Glucose: 101 mg/dL — ABNORMAL HIGH (ref 65–99)
Potassium: 4.8 mmol/L (ref 3.5–5.2)
Sodium: 143 mmol/L (ref 134–144)
Total Protein: 6.5 g/dL (ref 6.0–8.5)

## 2020-05-17 LAB — LIPID PANEL
Chol/HDL Ratio: 2.6 ratio (ref 0.0–5.0)
Cholesterol, Total: 174 mg/dL (ref 100–199)
HDL: 66 mg/dL (ref >39)
LDL Chol Calc (NIH): 95 mg/dL (ref 0–99)
Triglycerides: 68 mg/dL (ref 0–149)
VLDL Cholesterol Cal: 13 mg/dL (ref 5–40)

## 2020-05-17 LAB — HEPATITIS C ANTIBODY: Hep C Virus Ab: <0.1 s/co ratio (ref 0.0–0.9)

## 2020-05-17 LAB — HIV ANTIBODY (ROUTINE TESTING W REFLEX): HIV Screen 4th Generation wRfx: NONREACTIVE

## 2021-05-16 ENCOUNTER — Encounter: Payer: Self-pay | Admitting: Nurse Practitioner

## 2021-05-16 ENCOUNTER — Ambulatory Visit: Payer: BC Managed Care – PPO | Admitting: Nurse Practitioner

## 2021-05-16 ENCOUNTER — Other Ambulatory Visit: Payer: Self-pay

## 2021-05-16 VITALS — BP 121/84 | HR 69 | Resp 16 | Wt 214.8 lb

## 2021-05-16 DIAGNOSIS — J4520 Mild intermittent asthma, uncomplicated: Secondary | ICD-10-CM | POA: Diagnosis not present

## 2021-05-16 DIAGNOSIS — Z8249 Family history of ischemic heart disease and other diseases of the circulatory system: Secondary | ICD-10-CM

## 2021-05-16 DIAGNOSIS — H6123 Impacted cerumen, bilateral: Secondary | ICD-10-CM

## 2021-05-16 MED ORDER — ALBUTEROL SULFATE HFA 108 (90 BASE) MCG/ACT IN AERS: 2.0000 | INHALATION_SPRAY | Freq: Four times a day (QID) | RESPIRATORY_TRACT | 2 refills | Status: DC | PRN

## 2021-05-16 NOTE — Progress Notes (Signed)
Patient states to still having a little bit if wheezing and would like to talk to the Doctor more about this issue, pt also requesting a refill on inhaler.

## 2021-05-16 NOTE — Patient Instructions (Addendum)
Asthma, Adult Asthma is a long-term (chronic) condition in which the airways get tight and narrow. The airways are the breathing passages that lead from the nose and mouth down into the lungs. A person with asthma will have times when symptoms get worse. These are called asthma attacks. They can cause coughing, whistling sounds when you breathe (wheezing), shortness of breath, and chest pain. They can make it hard to breathe. There is no cure for asthma, but medicines and lifestyle changes can help control it. There are many things that can bring on an asthma attack or make asthma symptoms worse (triggers). Common triggers include: Mold. Dust. Cigarette smoke. Cockroaches. Things that can cause allergy symptoms (allergens). These include animal skin flakes (dander) and pollen from trees or grass. Things that pollute the air. These may include household cleaners, wood smoke, smog, or chemical odors. Cold air, weather changes, and wind. Crying or laughing hard. Stress. Certain medicines or drugs. Certain foods such as dried fruit, potato chips, and grape juice. Infections, such as a cold or the flu. Certain medical conditions or diseases. Exercise or tiring activities. Asthma may be treated with medicines and by staying away from the things that cause asthma attacks. Types of medicines may include: Controller medicines. These help prevent asthma symptoms. They are usually taken every day. Fast-acting reliever or rescue medicines. These quickly relieve asthma symptoms. They are used as needed and provide short-term relief. Allergy medicines if your attacks are brought on by allergens. Medicines to help control the body's defense (immune) system. Follow these instructions at home: Avoiding triggers in your home Change your heating and air conditioning filter often. Limit your use of fireplaces and wood stoves. Get rid of pests (such as roaches and mice) and their droppings. Throw away plants  if you see mold on them. Clean your floors. Dust regularly. Use cleaning products that do not smell. Have someone vacuum when you are not home. Use a vacuum cleaner with a HEPA filter if possible. Replace carpet with wood, tile, or vinyl flooring. Carpet can trap animal skin flakes and dust. Use allergy-proof pillows, mattress covers, and box spring covers. Wash bed sheets and blankets every week in hot water. Dry them in a dryer. Keep your bedroom free of any triggers. Avoid pets and keep windows closed when things that cause allergy symptoms are in the air. Use blankets that are made of polyester or cotton. Clean bathrooms and kitchens with bleach. If possible, have someone repaint the walls in these rooms with mold-resistant paint. Keep out of the rooms that are being cleaned and painted. Wash your hands often with soap and water. If soap and water are not available, use hand sanitizer. Do not allow anyone to smoke in your home. General instructions Take over-the-counter and prescription medicines only as told by your doctor. Talk with your doctor if you have questions about how or when to take your medicines. Make note if you need to use your medicines more often than usual. Do not use any products that contain nicotine or tobacco, such as cigarettes and e-cigarettes. If you need help quitting, ask your doctor. Stay away from secondhand smoke. Avoid doing things outdoors when allergen counts are high and when air quality is low. Wear a ski mask when doing outdoor activities in the winter. The mask should cover your nose and mouth. Exercise indoors on cold days if you can. Warm up before you exercise. Take time to cool down after exercise. Use a peak flow meter as  told by your doctor. A peak flow meter is a tool that measures how well the lungs are working. Keep track of the peak flow meter's readings. Write them down. Follow your asthma action plan. This is a written plan for taking care  of your asthma and treating your attacks. Make sure you get all the shots (vaccines) that your doctor recommends. Ask your doctor about a flu shot and a pneumonia shot. Keep all follow-up visits as told by your doctor. This is important. Contact a doctor if: You have wheezing, shortness of breath, or a cough even while taking medicine to prevent attacks. The mucus you cough up (sputum) is thicker than usual. The mucus you cough up changes from clear or white to yellow, green, gray, or bloody. You have problems from the medicine you are taking, such as: A rash. Itching. Swelling. Trouble breathing. You need reliever medicines more than 2-3 times a week. Your peak flow reading is still at 50-79% of your personal best after following the action plan for 1 hour. You have a fever. Get help right away if: You seem to be worse and are not responding to medicine during an asthma attack. You are short of breath even at rest. You get short of breath when doing very little activity. You have trouble eating, drinking, or talking. You have chest pain or tightness. You have a fast heartbeat. Your lips or fingernails start to turn blue. You are light-headed or dizzy, or you faint. Your peak flow is less than 50% of your personal best. You feel too tired to breathe normally. Summary Asthma is a long-term (chronic) condition in which the airways get tight and narrow. An asthma attack can make it hard to breathe. Asthma cannot be cured, but medicines and lifestyle changes can help control it. Make sure you understand how to avoid triggers and how and when to use your medicines. This information is not intended to replace advice given to you by your health care provider. Make sure you discuss any questions you have with your health care provider. Document Revised: 07/31/2019 Document Reviewed: 08/10/2019 Elsevier Patient Education  [place] Maintenance, Male Adopting a healthy  lifestyle and getting preventive care are important in promoting health and wellness. Ask your health care provider about: The right schedule for you to have regular tests and exams. Things you can do on your own to prevent diseases and keep yourself healthy. What should I know about diet, weight, and exercise? Eat a healthy diet  Eat a diet that includes plenty of vegetables, fruits, low-fat dairy products, and lean protein. Do not eat a lot of foods that are high in solid fats, added sugars, or sodium. Maintain a healthy weight Body mass index (BMI) is a measurement that can be used to identify possible weight problems. It estimates body fat based on height and weight. Your health care provider can help determine your BMI and help you achieve or maintain a healthy weight. Get regular exercise Get regular exercise. This is one of the most important things you can do for your health. Most adults should: Exercise for at least 150 minutes each week. The exercise should increase your heart rate and make you sweat (moderate-intensity exercise). Do strengthening exercises at least twice a week. This is in addition to the moderate-intensity exercise. Spend less time sitting. Even light physical activity can be beneficial. Watch cholesterol and blood lipids Have your blood tested for lipids and cholesterol at 36 years of age,  then have this test every 5 years. You may need to have your cholesterol levels checked more often if: Your lipid or cholesterol levels are high. You are older than 36 years of age. You are at high risk for heart disease. What should I know about cancer screening? Many types of cancers can be detected early and may often be prevented. Depending on your health history and family history, you may need to have cancer screening at various ages. This may include screening for: Colorectal cancer. Prostate cancer. Skin cancer. Lung cancer. What should I know about heart disease,  diabetes, and high blood pressure? Blood pressure and heart disease High blood pressure causes heart disease and increases the risk of stroke. This is more likely to develop in people who have high blood pressure readings or are overweight. Talk with your health care provider about your target blood pressure readings. Have your blood pressure checked: Every 3-5 years if you are 35-67 years of age. Every year if you are 55 years old or older. If you are between the ages of 81 and 61 and are a current or former smoker, ask your health care provider if you should have a one-time screening for abdominal aortic aneurysm (AAA). Diabetes Have regular diabetes screenings. This checks your fasting blood sugar level. Have the screening done: Once every three years after age 20 if you are at a normal weight and have a low risk for diabetes. More often and at a younger age if you are overweight or have a high risk for diabetes. What should I know about preventing infection? Hepatitis B If you have a higher risk for hepatitis B, you should be screened for this virus. Talk with your health care provider to find out if you are at risk for hepatitis B infection. Hepatitis C Blood testing is recommended for: Everyone born from [birth year] through 1965. Anyone with known risk factors for hepatitis C. Sexually transmitted infections (STIs) You should be screened each year for STIs, including gonorrhea and chlamydia, if: You are sexually active and are younger than 36 years of age. You are older than 36 years of age and your health care provider tells you that you are at risk for this type of infection. Your sexual activity has changed since you were last screened, and you are at increased risk for chlamydia or gonorrhea. Ask your health care provider if you are at risk. Ask your health care provider about whether you are at high risk for HIV. Your health care provider may recommend a prescription medicine to help  prevent HIV infection. If you choose to take medicine to prevent HIV, you should first get tested for HIV. You should then be tested every 3 months for as long as you are taking the medicine. Follow these instructions at home: Alcohol use Do not drink alcohol if your health care provider tells you not to drink. If you drink alcohol: Limit how much you have to 0-2 drinks a day. Know how much alcohol is in your drink. In the [place] one drink equals one 12 oz bottle of beer (355 mL), one 5 oz glass of wine (148 mL), or one 1 oz glass of hard liquor (44 mL). Lifestyle Do not use any products that contain nicotine or tobacco. These products include cigarettes, chewing tobacco, and vaping devices, such as e-cigarettes. If you need help quitting, ask your health care provider. Do not use street drugs. Do not share needles. Ask your health care provider for help  if you need support or information about quitting drugs. General instructions Schedule regular health, dental, and eye exams. Stay current with your vaccines. Tell your health care provider if: You often feel depressed. You have ever been abused or do not feel safe at home. Summary Adopting a healthy lifestyle and getting preventive care are important in promoting health and wellness. Follow your health care provider's instructions about healthy diet, exercising, and getting tested or screened for diseases. Follow your health care provider's instructions on monitoring your cholesterol and blood pressure. This information is not intended to replace advice given to you by your health care provider. Make sure you discuss any questions you have with your health care provider. Document Revised: 08/27/2020 Document Reviewed: 08/27/2020 Elsevier Patient Education  [place].

## 2021-05-16 NOTE — Progress Notes (Signed)
Established Patient Office Visit  Subjective:  Patient ID: Steven Hancock, male    DOB: [birth year]  Age: 36 y.o. MRN: [id number]  CC:  Chief Complaint  Patient presents with   Asthma    HPI Steven Hancock presents for follow up. He has a stories of asthma.   Asthma Patient presents for evaluation of wheezing. The patient has been previously diagnosed with asthma. Symptoms currently include wheezing and occur less than 2x/week. Observed precipitants include: no identifiable factor. Current limitations in activity from asthma: none. Number of days of school or work missed in the last month: 0.  Does he do nebulizer treatments? no Does he use an inhaler? yes Does he use a spacer with MDIs? no Does he monitor peak flow rates? no    He has continued his lifestyle modification.  His weight has gone down approximately 3 pounds.  He does report that his father in at the age of 82, MI.  He also states that his father had pneumonia and was unaware.  He denies any other family history of cardiac disease and his paternal relatives are alive and well. He does continue to work at [employer].    No past medical history on file.  Past Surgical History:  Procedure Laterality Date   WISDOM TOOTH EXTRACTION      Family History  Problem Relation Age of Onset   Hypertension Mother    Heart disease Father    Hypertension Maternal Grandfather    Heart disease Maternal Grandfather     Social History   Socioeconomic History   Marital status: Single    Spouse name: Not on file   Number of children: Not on file   Years of education: Not on file   Highest education level: Not on file  Occupational History   Not on file  Tobacco Use   Smoking status: Never   Smokeless tobacco: Never  Vaping Use   Vaping Use: Never used  Substance and Sexual Activity   Alcohol use: Yes    Comment: not often   Drug use: Yes    Types: Marijuana    Comment: daily   Sexual activity: Yes  Other Topics Concern    Not on file  Social History Narrative   Not on file   Social Determinants of Health   Financial Resource Strain: Not on file  Food Insecurity: Not on file  Transportation Needs: Not on file  Physical Activity: Not on file  Stress: Not on file  Social Connections: Not on file  Intimate Partner Violence: Not on file    Outpatient Medications Prior to Visit  Medication Sig Dispense Refill   Multiple Vitamin (MULTIVITAMIN) tablet Take 1 tablet by mouth daily.     Ibuprofen (ADVIL LIQUI-GELS MINIS PO) Take by mouth. (Patient not taking: Reported on 05/16/2021)     albuterol (VENTOLIN HFA) 108 (90 Base) MCG/ACT inhaler Inhale 2 puffs into the lungs every 6 (six) hours as needed for wheezing or shortness of breath. (Patient not taking: Reported on 05/16/2020) 8.5 g 2   No facility-administered medications prior to visit.    No Known Allergies  ROS Review of Systems  Respiratory:  Positive for wheezing.   Cardiovascular:  Negative for chest pain.  Neurological:  Positive for headaches (occasional uses APAP and Aleve). Negative for dizziness.     Objective:    Physical Exam Constitutional:      Appearance: He is obese.  HENT:     Head: Normocephalic.  Right Ear: There is impacted cerumen.     Left Ear: There is impacted cerumen.     Nose: Nose normal.     Mouth/Throat:     Mouth: Mucous membranes are moist.  Cardiovascular:     Rate and Rhythm: Normal rate and regular rhythm.     Pulses: Normal pulses.     Heart sounds: Normal heart sounds.  Pulmonary:     Effort: Pulmonary effort is normal.     Breath sounds: Normal breath sounds.  Abdominal:     Palpations: Abdomen is soft.  Musculoskeletal:        General: Normal range of motion.     Cervical back: Normal range of motion.  Skin:    General: Skin is warm and dry.     Capillary Refill: Capillary refill takes less than 2 seconds.  Neurological:     General: No focal deficit present.     Mental Status: He is  alert and oriented to person, place, and time.  Psychiatric:        Mood and Affect: Mood normal.        Behavior: Behavior normal.        Thought Content: Thought content normal.        Judgment: Judgment normal.    BP 121/84    Pulse 69    Resp 16    Wt 214 lb 12.8 oz (97.4 kg)    SpO2 100%    BMI 31.72 kg/m  Wt Readings from Last 3 Encounters:  05/16/21 214 lb 12.8 oz (97.4 kg)  05/16/20 217 lb 12.8 oz (98.8 kg)  11/14/19 (!) 229 lb (103.9 kg)     Health Maintenance Due  Topic Date Due   COVID-19 Vaccine (1) Never done    There are no preventive care reminders to display for this patient.  No results found for: TSH No results found for: WBC, HGB, HCT, MCV, PLT Lab Results  Component Value Date   NA 143 05/16/2020   K 4.8 05/16/2020   GLUCOSE 101 (H) 05/16/2020   BUN 15 05/16/2020   CREATININE 1.02 05/16/2020   BILITOT 0.8 05/16/2020   ALKPHOS 89 05/16/2020   AST 26 05/16/2020   PROT 6.5 05/16/2020   ALBUMIN 4.3 05/16/2020   CALCIUM 9.8 05/16/2020   Lab Results  Component Value Date   CHOL 174 05/16/2020   Lab Results  Component Value Date   HDL 66 05/16/2020   Lab Results  Component Value Date   LDLCALC 95 05/16/2020   Lab Results  Component Value Date   TRIG 68 05/16/2020   Lab Results  Component Value Date   CHOLHDL 2.6 05/16/2020   No results found for: HGBA1C    Assessment & Plan:   Problem List Items Addressed This Visit       Respiratory   Mild intermittent asthma - Primary Stable  Education provided   Relevant Medications   albuterol (VENTOLIN HFA) 108 (90 Base) MCG/ACT inhaler   Other Visit Diagnoses     Bilateral impacted cerumen    Discussed treatment     Family history of MI (myocardial infarction)     Father passed at 21 yrs old Discussed risk factors.        Meds ordered this encounter  Medications   albuterol (VENTOLIN HFA) 108 (90 Base) MCG/ACT inhaler    Sig: Inhale 2 puffs into the lungs every 6 (six) hours  as needed for wheezing or shortness of breath.  Dispense:  8.5 g    Refill:  2    Order Specific Question:   Supervising Provider    Answer:   Quentin AngstJEGEDE, OLUGBEMIGA E [[id number]]    Follow-up: No follow-ups on file.    Barbette Merinorystal M Aaylah Pokorny, NP

## 2021-10-29 ENCOUNTER — Other Ambulatory Visit: Payer: Self-pay | Admitting: Family Medicine

## 2021-10-29 MED ORDER — ALBUTEROL SULFATE HFA 108 (90 BASE) MCG/ACT IN AERS: 2.0000 | INHALATION_SPRAY | Freq: Four times a day (QID) | RESPIRATORY_TRACT | 2 refills | Status: DC | PRN

## 2021-10-29 NOTE — Progress Notes (Signed)
Meds ordered this encounter  Medications   albuterol (VENTOLIN HFA) 108 (90 Base) MCG/ACT inhaler    Sig: Inhale 2 puffs into the lungs every 6 (six) hours as needed for wheezing or shortness of breath.    Dispense:  8.5 g    Refill:  2    Order Specific Question:   Supervising Provider    Answer:   Quentin Angst [id number]

## 2022-03-24 ENCOUNTER — Other Ambulatory Visit: Payer: Self-pay

## 2022-03-24 MED ORDER — ALBUTEROL SULFATE HFA 108 (90 BASE) MCG/ACT IN AERS: 2.0000 | INHALATION_SPRAY | Freq: Four times a day (QID) | RESPIRATORY_TRACT | 2 refills | Status: DC | PRN

## 2022-03-24 NOTE — Telephone Encounter (Signed)
[facility] is requesting to fill pt albuterol . Please advise . KH

## 2022-05-16 ENCOUNTER — Encounter: Payer: Self-pay | Admitting: Nurse Practitioner

## 2022-05-16 ENCOUNTER — Ambulatory Visit: Payer: BC Managed Care – PPO | Admitting: Nurse Practitioner

## 2022-05-16 ENCOUNTER — Ambulatory Visit (INDEPENDENT_AMBULATORY_CARE_PROVIDER_SITE_OTHER): Payer: BC Managed Care – PPO | Admitting: Nurse Practitioner

## 2022-05-16 VITALS — BP 113/80 | HR 78 | Temp 97.1°F | Ht 69.25 in | Wt 221.8 lb

## 2022-05-16 DIAGNOSIS — Z13220 Encounter for screening for lipoid disorders: Secondary | ICD-10-CM

## 2022-05-16 DIAGNOSIS — Z0000 Encounter for general adult medical examination without abnormal findings: Secondary | ICD-10-CM | POA: Diagnosis not present

## 2022-05-16 MED ORDER — ALBUTEROL SULFATE HFA 108 (90 BASE) MCG/ACT IN AERS: 2.0000 | INHALATION_SPRAY | Freq: Four times a day (QID) | RESPIRATORY_TRACT | 2 refills | Status: DC | PRN

## 2022-05-16 NOTE — Assessment & Plan Note (Signed)
-  CBC - Comprehensive metabolic panel - Lipid Panel  2. Lipid screening  - Lipid Panel   Follow up:  Follow up in 1 year

## 2022-05-16 NOTE — Patient Instructions (Addendum)
1. Routine adult health maintenance  - CBC - Comprehensive metabolic panel - Lipid Panel  2. Lipid screening  - Lipid Panel   Follow up:  Follow up in 1 year

## 2022-05-16 NOTE — Progress Notes (Signed)
@Patient  ID: Steven Hancock, male    DOB: 03-04-1986, 37 y.o.   MRN: [id number]  Chief Complaint  Patient presents with   Annual Exam    Referring provider: Vevelyn Francois, NP  HPI  Steven Hancock presents for follow up. He has a history of asthma.   Patient presents today for a yearly physical.  He does have a history of asthma and needs refill on inhaler.   Asthma  Symptoms currently include wheezing and occur less than 2x/week. Observed precipitants include: no identifiable factor. Current limitations in activity from asthma: none. Number of days of school or work missed in the last month: 0.   Does he do nebulizer treatments? no Does he use an inhaler? yes Does he use a spacer with MDIs? no Does he monitor peak flow rates? no      He has continued his lifestyle modification. He does report that his father in at the age of 1, MI. He denies any other family history of cardiac disease and his paternal relatives are alive and well. He does continue to work at [employer].    Denies f/c/s, n/v/d, hemoptysis, PND, leg swelling Denies chest pain or edema      No Known Allergies   There is no immunization history on file for this patient.  History reviewed. No pertinent past medical history.  Tobacco History: Social History   Tobacco Use  Smoking Status Never  Smokeless Tobacco Never   Counseling given: Not Answered   Outpatient Encounter Medications as of 05/16/2022  Medication Sig   Ibuprofen (ADVIL LIQUI-GELS MINIS PO) Take by mouth.   Multiple Vitamin (MULTIVITAMIN) tablet Take 1 tablet by mouth daily.   [DISCONTINUED] albuterol (VENTOLIN HFA) 108 (90 Base) MCG/ACT inhaler Inhale 2 puffs into the lungs every 6 (six) hours as needed for wheezing or shortness of breath.   albuterol (VENTOLIN HFA) 108 (90 Base) MCG/ACT inhaler Inhale 2 puffs into the lungs every 6 (six) hours as needed for wheezing or shortness of breath.   No facility-administered encounter  medications on file as of 05/16/2022.     Review of Systems  Review of Systems  Constitutional: Negative.   HENT: Negative.    Cardiovascular: Negative.   Gastrointestinal: Negative.   Allergic/Immunologic: Negative.   Neurological: Negative.   Psychiatric/Behavioral: Negative.         Physical Exam  BP 113/80   Pulse 78   Temp (!) 97.1 F (36.2 C) (Temporal)   Ht 5' 9.25" (1.759 m)   Wt 221 lb 12.8 oz (100.6 kg)   SpO2 98%   BMI 32.52 kg/m   Wt Readings from Last 5 Encounters:  05/16/22 221 lb 12.8 oz (100.6 kg)  05/16/21 214 lb 12.8 oz (97.4 kg)  05/16/20 217 lb 12.8 oz (98.8 kg)  11/14/19 (!) 229 lb (103.9 kg)  07/28/11 238 lb 6.4 oz (108.1 kg)     Physical Exam Vitals and nursing note reviewed.  Constitutional:      General: He is not in acute distress.    Appearance: He is well-developed.  Cardiovascular:     Rate and Rhythm: Normal rate and regular rhythm.  Pulmonary:     Effort: Pulmonary effort is normal.     Breath sounds: Normal breath sounds.  Skin:    General: Skin is warm and dry.  Neurological:     Mental Status: He is alert and oriented to person, place, and time.       Assessment & Plan:  Routine adult health maintenance - CBC - Comprehensive metabolic panel - Lipid Panel  2. Lipid screening  - Lipid Panel   Follow up:  Follow up in 1 year     Fenton Foy, NP 05/16/2022

## 2022-05-17 LAB — CBC
Hematocrit: 36.1 % — ABNORMAL LOW (ref 37.5–51.0)
Hemoglobin: 11.9 g/dL — ABNORMAL LOW (ref 13.0–17.7)
MCH: 31.7 pg (ref 26.6–33.0)
MCHC: 33 g/dL (ref 31.5–35.7)
MCV: 96 fL (ref 79–97)
Platelets: 195 10*3/uL (ref 150–450)
RBC: 3.75 x10E6/uL — ABNORMAL LOW (ref 4.14–5.80)
RDW: 11.9 % (ref 11.6–15.4)
WBC: 3.9 10*3/uL (ref 3.4–10.8)

## 2022-05-17 LAB — COMPREHENSIVE METABOLIC PANEL
ALT: 33 IU/L (ref 0–44)
AST: 27 IU/L (ref 0–40)
Albumin/Globulin Ratio: 2.3 — ABNORMAL HIGH (ref 1.2–2.2)
Albumin: 4.5 g/dL (ref 4.1–5.1)
Alkaline Phosphatase: 80 IU/L (ref 44–121)
BUN/Creatinine Ratio: 16 (ref 9–20)
BUN: 18 mg/dL (ref 6–20)
Bilirubin Total: 0.8 mg/dL (ref 0.0–1.2)
CO2: 25 mmol/L (ref 20–29)
Calcium: 9.6 mg/dL (ref 8.7–10.2)
Chloride: 102 mmol/L (ref 96–106)
Creatinine, Ser: 1.11 mg/dL (ref 0.76–1.27)
Globulin, Total: 2 g/dL (ref 1.5–4.5)
Glucose: 91 mg/dL (ref 70–99)
Potassium: 4.5 mmol/L (ref 3.5–5.2)
Sodium: 140 mmol/L (ref 134–144)
Total Protein: 6.5 g/dL (ref 6.0–8.5)
eGFR: 88 mL/min/{1.73_m2} (ref >59)

## 2022-05-17 LAB — LIPID PANEL
Chol/HDL Ratio: 2.7 ratio (ref 0.0–5.0)
Cholesterol, Total: 169 mg/dL (ref 100–199)
HDL: 62 mg/dL (ref >39)
LDL Chol Calc (NIH): 91 mg/dL (ref 0–99)
Triglycerides: 87 mg/dL (ref 0–149)
VLDL Cholesterol Cal: 16 mg/dL (ref 5–40)

## 2022-08-28 ENCOUNTER — Other Ambulatory Visit: Payer: Self-pay | Admitting: Nurse Practitioner

## 2022-08-28 NOTE — Telephone Encounter (Signed)
Please advise Kh 

## 2023-05-18 ENCOUNTER — Ambulatory Visit (INDEPENDENT_AMBULATORY_CARE_PROVIDER_SITE_OTHER): Payer: BC Managed Care – PPO | Admitting: Nurse Practitioner

## 2023-05-18 ENCOUNTER — Encounter: Payer: Self-pay | Admitting: Nurse Practitioner

## 2023-05-18 VITALS — BP 114/74 | HR 77 | Temp 97.0°F | Wt 217.8 lb

## 2023-05-18 DIAGNOSIS — Z0000 Encounter for general adult medical examination without abnormal findings: Secondary | ICD-10-CM

## 2023-05-18 DIAGNOSIS — J4520 Mild intermittent asthma, uncomplicated: Secondary | ICD-10-CM

## 2023-05-18 DIAGNOSIS — Z13220 Encounter for screening for lipoid disorders: Secondary | ICD-10-CM

## 2023-05-18 DIAGNOSIS — Z1329 Encounter for screening for other suspected endocrine disorder: Secondary | ICD-10-CM | POA: Diagnosis not present

## 2023-05-18 MED ORDER — ALBUTEROL SULFATE HFA 108 (90 BASE) MCG/ACT IN AERS: 2.0000 | INHALATION_SPRAY | Freq: Four times a day (QID) | RESPIRATORY_TRACT | 2 refills | Status: DC | PRN

## 2023-05-18 NOTE — Patient Instructions (Addendum)
1. Thyroid disorder screen (Primary)  - TSH   2. Routine adult health maintenance  - CBC - Comprehensive metabolic panel   3. Lipid screening  - Lipid Panel   4. Mild intermittent asthma, unspecified whether complicated  - albuterol (VENTOLIN HFA) 108 (90 Base) MCG/ACT inhaler; Inhale 2 puffs into the lungs every 6 (six) hours as needed for wheezing or shortness of breath.  Dispense: 8.5 g; Refill: 2   Follow up:  Follow up in 3 months

## 2023-05-18 NOTE — Progress Notes (Signed)
Subjective   Patient ID: Steven Hancock, male    DOB: [birth year], 38 y.o.   MRN: [id number]  Chief Complaint  Patient presents with   Medical Management of Chronic Issues    1 year follow up Patient stated that he just started snoring and wanted to discuss It    Referring provider: Ivonne Andrew, NP  Rogue Bussing is a 38 y.o. male with No past medical history on file.   HPI  Patient presents today for a yearly physical.  He does have a history of asthma and needs refill on inhaler.   Asthma   Symptoms currently include wheezing and occur less than 2x/week. Observed precipitants include: no identifiable factor. Current limitations in activity from asthma: none. Number of days of school or work missed in the last month: 0.   Does he do nebulizer treatments? no Does he use an inhaler? yes Does he use a spacer with MDIs? no Does he monitor peak flow rates? no      He has continued his lifestyle modification. He does report that his father in at the age of 43, MI. He denies any other family history of cardiac disease and his paternal relatives are alive and well. He does continue to work at [employer] and [employer].  Has noticed some minor snoring. Thinks this could be due to asthma. We discussed possible sleep study if symptoms worsen. Is working on losing weight.   Denies f/c/s, n/v/d, hemoptysis, PND, leg swelling Denies chest pain or edema.     No Known Allergies   There is no immunization history on file for this patient.  Tobacco History: Social History   Tobacco Use  Smoking Status Never  Smokeless Tobacco Never   Counseling given: Not Answered   Outpatient Encounter Medications as of 05/18/2023  Medication Sig   Multiple Vitamin (MULTIVITAMIN) tablet Take 1 tablet by mouth daily.   albuterol (VENTOLIN HFA) 108 (90 Base) MCG/ACT inhaler Inhale 2 puffs into the lungs every 6 (six) hours as needed for wheezing or shortness of breath.   Ibuprofen (ADVIL  LIQUI-GELS MINIS PO) Take by mouth. (Patient not taking: Reported on 05/18/2023)   [DISCONTINUED] albuterol (VENTOLIN HFA) 108 (90 Base) MCG/ACT inhaler INHALE 2 PUFFS INTO THE LUNGS EVERY 6 HOURS AS NEEDED FOR WHEEZING OR SHORTNESS OF BREATH (Patient not taking: Reported on 05/18/2023)   No facility-administered encounter medications on file as of 05/18/2023.    Review of Systems  Review of Systems  Constitutional: Negative.   HENT: Negative.    Cardiovascular: Negative.   Gastrointestinal: Negative.   Allergic/Immunologic: Negative.   Neurological: Negative.   Psychiatric/Behavioral: Negative.       Objective:   BP 114/74   Pulse 77   Temp (!) 97 F (36.1 C)   Wt 217 lb 12.8 oz (98.8 kg)   SpO2 99%   BMI 31.93 kg/m   Wt Readings from Last 5 Encounters:  05/18/23 217 lb 12.8 oz (98.8 kg)  05/16/22 221 lb 12.8 oz (100.6 kg)  05/16/21 214 lb 12.8 oz (97.4 kg)  05/16/20 217 lb 12.8 oz (98.8 kg)  11/14/19 (!) 229 lb (103.9 kg)     Physical Exam Vitals and nursing note reviewed.  Constitutional:      General: He is not in acute distress.    Appearance: He is well-developed.  Cardiovascular:     Rate and Rhythm: Normal rate and regular rhythm.  Pulmonary:     Effort: Pulmonary effort is normal.  Breath sounds: Normal breath sounds.  Skin:    General: Skin is warm and dry.  Neurological:     Mental Status: He is alert and oriented to person, place, and time.       Assessment & Plan:   Thyroid disorder screen -     TSH  Routine adult health maintenance -     CBC -     Comprehensive metabolic panel  Lipid screening -     Lipid panel  Mild intermittent asthma, unspecified whether complicated -     Albuterol Sulfate HFA; Inhale 2 puffs into the lungs every 6 (six) hours as needed for wheezing or shortness of breath.  Dispense: 8.5 g; Refill: 2     Return in about 1 year (around 05/17/2024) for Physical.     Ivonne Andrew, NP 05/18/2023

## 2023-05-19 LAB — COMPREHENSIVE METABOLIC PANEL
ALT: 17 [IU]/L (ref 0–44)
AST: 21 [IU]/L (ref 0–40)
Albumin: 4.5 g/dL (ref 4.1–5.1)
Alkaline Phosphatase: 78 [IU]/L (ref 44–121)
BUN/Creatinine Ratio: 16 (ref 9–20)
BUN: 16 mg/dL (ref 6–20)
Bilirubin Total: 0.7 mg/dL (ref 0.0–1.2)
CO2: 26 mmol/L (ref 20–29)
Calcium: 9.4 mg/dL (ref 8.7–10.2)
Chloride: 101 mmol/L (ref 96–106)
Creatinine, Ser: 0.99 mg/dL (ref 0.76–1.27)
Globulin, Total: 1.8 g/dL (ref 1.5–4.5)
Glucose: 91 mg/dL (ref 70–99)
Potassium: 4.9 mmol/L (ref 3.5–5.2)
Sodium: 139 mmol/L (ref 134–144)
Total Protein: 6.3 g/dL (ref 6.0–8.5)
eGFR: 101 mL/min/{1.73_m2} (ref >59)

## 2023-05-19 LAB — LIPID PANEL
Chol/HDL Ratio: 2.7 {ratio} (ref 0.0–5.0)
Cholesterol, Total: 161 mg/dL (ref 100–199)
HDL: 60 mg/dL (ref >39)
LDL Chol Calc (NIH): 83 mg/dL (ref 0–99)
Triglycerides: 97 mg/dL (ref 0–149)
VLDL Cholesterol Cal: 18 mg/dL (ref 5–40)

## 2023-05-19 LAB — TSH: TSH: 1.61 u[IU]/mL (ref 0.450–4.500)

## 2023-05-19 LAB — CBC
Hematocrit: 42.1 % (ref 37.5–51.0)
Hemoglobin: 13 g/dL (ref 13.0–17.7)
MCH: 26.6 pg (ref 26.6–33.0)
MCHC: 30.9 g/dL — ABNORMAL LOW (ref 31.5–35.7)
MCV: 86 fL (ref 79–97)
Platelets: 262 10*3/uL (ref 150–450)
RBC: 4.89 x10E6/uL (ref 4.14–5.80)
RDW: 12.6 % (ref 11.6–15.4)
WBC: 6.2 10*3/uL (ref 3.4–10.8)

## 2023-07-29 ENCOUNTER — Other Ambulatory Visit: Payer: Self-pay | Admitting: Nurse Practitioner

## 2023-07-29 DIAGNOSIS — J4520 Mild intermittent asthma, uncomplicated: Secondary | ICD-10-CM

## 2024-01-28 ENCOUNTER — Encounter: Payer: Self-pay | Admitting: Nurse Practitioner

## 2024-01-28 ENCOUNTER — Ambulatory Visit: Admitting: Nurse Practitioner

## 2024-01-28 VITALS — BP 116/76 | HR 78 | Wt 212.0 lb

## 2024-01-28 DIAGNOSIS — L84 Corns and callosities: Secondary | ICD-10-CM

## 2024-01-28 MED ORDER — GEL CALLUS REMOVERS 40 % EX PADS: 1.0000 | MEDICATED_PAD | CUTANEOUS | 0 refills | Status: AC

## 2024-01-28 MED ORDER — GEL CALLUS REMOVERS 40 % EX PADS: 1.0000 | MEDICATED_PAD | Freq: Every day | CUTANEOUS | 0 refills | Status: DC

## 2024-01-28 NOTE — Progress Notes (Signed)
     Subjective:     Patient ID: Steven Hancock, male    DOB: 04-01-1986, 38 y.o.   MRN: [id number]  Chief Complaint  Patient presents with   Foot Pain    Right     HPI  Patient presents today for right foot pain.  He does have a callused area to the bottom of his right foot.  We will order callus pads and place a referral for patient to podiatry today. Denies f/c/s, n/v/d, hemoptysis, PND, leg swelling Denies chest pain or edema      Review of Systems  Constitutional: Negative.   HENT: Negative.    Cardiovascular: Negative.   Gastrointestinal: Negative.   Allergic/Immunologic: Negative.   Neurological: Negative.   Psychiatric/Behavioral: Negative.          Objective:    BP 116/76   Pulse 78   Wt 212 lb (96.2 kg)   SpO2 100%   BMI 31.08 kg/m    Physical Exam Constitutional:      Appearance: Normal appearance.  Musculoskeletal:       Feet:  Feet:     Left foot:     Skin integrity: Callus present.  Neurological:     Mental Status: He is alert.     No results found for any visits on 01/28/24.      Assessment & Plan:   Problem List Items Addressed This Visit   None Visit Diagnoses       Callus    -  Primary   Relevant Medications   Salicylic Acid (GEL CALLUS REMOVERS) 40 % PADS   Other Relevant Orders   Ambulatory referral to Podiatry       Meds ordered this encounter  Medications   DISCONTD: Salicylic Acid (GEL CALLUS REMOVERS) 40 % PADS    Sig: Apply 1 Application topically daily.    Dispense:  10 each    Refill:  0   Salicylic Acid (GEL CALLUS REMOVERS) 40 % PADS    Sig: Apply 1 Application topically every other day.    Dispense:  10 each    Refill:  0    Return if symptoms worsen or fail to improve.  Bascom GORMAN Borer, NP

## 2024-02-08 ENCOUNTER — Ambulatory Visit: Admitting: Podiatry

## 2024-02-08 ENCOUNTER — Encounter: Payer: Self-pay | Admitting: Podiatry

## 2024-02-08 DIAGNOSIS — D2371 Other benign neoplasm of skin of right lower limb, including hip: Secondary | ICD-10-CM

## 2024-02-08 NOTE — Progress Notes (Signed)
  Subjective:  Patient ID: Steven Hancock, male    DOB: 12-21-1985,   MRN: [id number]  Chief Complaint  Patient presents with   Callouses    I have a callus. (5th met right)    38 y.o. male presents for concern of right foot callus.  It has been present for several months.  Relates he works at [employer] and on his feet a lot.  States it started with a certain pair of shoes that he has now gotten rid of.  Denies any treatments.. Denies any other pedal complaints. Denies n/v/f/c.   No past medical history on file.  Objective:  Physical Exam: Vascular: DP/PT pulses 2/4 bilateral. CFT <3 seconds. Normal hair growth on digits. No edema.  Skin. No lacerations or abrasions bilateral feet.  Right fifth metatarsal head hyperkeratotic lesion with cord aspect.  Disruption of skin lines noted.  Benign neoplasm of Musculoskeletal: MMT 5/5 bilateral lower extremities in DF, PF, Inversion and Eversion. Deceased ROM in DF of ankle joint.  Neurological: Sensation intact to light touch.   Assessment:   1. Benign neoplasm of skin of right foot      Plan:  Patient was evaluated and treated and all questions answered. -Discussed benign skin lesions with patient and treatment options.  -Hyperkeratotic tissue was debrided with chisel without incident.  -Applied salycylic acid treatment to area with dressing. Advised to remove bandaging tomorrow.  -Encouraged daily moisturizing -Discussed use of pumice stone -Advised good supportive shoes and inserts -Patient to return to office as needed or sooner if condition worsens.   Asberry Failing, DPM

## 2024-05-18 ENCOUNTER — Ambulatory Visit: Payer: Self-pay | Admitting: Nurse Practitioner

## 2024-05-18 ENCOUNTER — Encounter: Payer: Self-pay | Admitting: Nurse Practitioner

## 2024-05-18 VITALS — BP 125/87 | HR 84 | Temp 98.3°F | Wt 219.0 lb

## 2024-05-18 DIAGNOSIS — J4520 Mild intermittent asthma, uncomplicated: Secondary | ICD-10-CM

## 2024-05-18 DIAGNOSIS — Z0000 Encounter for general adult medical examination without abnormal findings: Secondary | ICD-10-CM

## 2024-05-18 DIAGNOSIS — Z13220 Encounter for screening for lipoid disorders: Secondary | ICD-10-CM

## 2024-05-18 DIAGNOSIS — Z3009 Encounter for other general counseling and advice on contraception: Secondary | ICD-10-CM

## 2024-05-18 DIAGNOSIS — Z1329 Encounter for screening for other suspected endocrine disorder: Secondary | ICD-10-CM

## 2024-05-18 MED ORDER — ALBUTEROL SULFATE HFA 108 (90 BASE) MCG/ACT IN AERS: 2.0000 | INHALATION_SPRAY | Freq: Four times a day (QID) | RESPIRATORY_TRACT | 1 refills | Status: AC | PRN

## 2024-05-18 NOTE — Patient Instructions (Signed)
 Health Maintenance, Male  Adopting a healthy lifestyle and getting preventive care are important in promoting health and wellness. Ask your health care provider about:  The right schedule for you to have regular tests and exams.  Things you can do on your own to prevent diseases and keep yourself healthy.  What should I know about diet, weight, and exercise?  Eat a healthy diet    Eat a diet that includes plenty of vegetables, fruits, low-fat dairy products, and lean protein.  Do not eat a lot of foods that are high in solid fats, added sugars, or sodium.  Maintain a healthy weight  Body mass index (BMI) is a measurement that can be used to identify possible weight problems. It estimates body fat based on height and weight. Your health care provider can help determine your BMI and help you achieve or maintain a healthy weight.  Get regular exercise  Get regular exercise. This is one of the most important things you can do for your health. Most adults should:  Exercise for at least 150 minutes each week. The exercise should increase your heart rate and make you sweat (moderate-intensity exercise).  Do strengthening exercises at least twice a week. This is in addition to the moderate-intensity exercise.  Spend less time sitting. Even light physical activity can be beneficial.  Watch cholesterol and blood lipids  Have your blood tested for lipids and cholesterol at 39 years of age, then have this test every 5 years.  You may need to have your cholesterol levels checked more often if:  Your lipid or cholesterol levels are high.  You are older than 39 years of age.  You are at high risk for heart disease.  What should I know about cancer screening?  Many types of cancers can be detected early and may often be prevented. Depending on your health history and family history, you may need to have cancer screening at various ages. This may include screening for:  Colorectal cancer.  Prostate cancer.  Skin cancer.  Lung  cancer.  What should I know about heart disease, diabetes, and high blood pressure?  Blood pressure and heart disease  High blood pressure causes heart disease and increases the risk of stroke. This is more likely to develop in people who have high blood pressure readings or are overweight.  Talk with your health care provider about your target blood pressure readings.  Have your blood pressure checked:  Every 3-5 years if you are 24-52 years of age.  Every year if you are 3 years old or older.  If you are between the ages of 60 and 72 and are a current or former smoker, ask your health care provider if you should have a one-time screening for abdominal aortic aneurysm (AAA).  Diabetes  Have regular diabetes screenings. This checks your fasting blood sugar level. Have the screening done:  Once every three years after age 66 if you are at a normal weight and have a low risk for diabetes.  More often and at a younger age if you are overweight or have a high risk for diabetes.  What should I know about preventing infection?  Hepatitis B  If you have a higher risk for hepatitis B, you should be screened for this virus. Talk with your health care provider to find out if you are at risk for hepatitis B infection.  Hepatitis C  Blood testing is recommended for:  Everyone born from [birth year] through 1965.  Anyone  with known risk factors for hepatitis C.  Sexually transmitted infections (STIs)  You should be screened each year for STIs, including gonorrhea and chlamydia, if:  You are sexually active and are younger than 39 years of age.  You are older than 39 years of age and your health care provider tells you that you are at risk for this type of infection.  Your sexual activity has changed since you were last screened, and you are at increased risk for chlamydia or gonorrhea. Ask your health care provider if you are at risk.  Ask your health care provider about whether you are at high risk for HIV. Your health care provider  may recommend a prescription medicine to help prevent HIV infection. If you choose to take medicine to prevent HIV, you should first get tested for HIV. You should then be tested every 3 months for as long as you are taking the medicine.  Follow these instructions at home:  Alcohol use  Do not drink alcohol if your health care provider tells you not to drink.  If you drink alcohol:  Limit how much you have to 0-2 drinks a day.  Know how much alcohol is in your drink. In the [place] one drink equals one 12 oz bottle of beer (355 mL), one 5 oz glass of wine (148 mL), or one 1 oz glass of hard liquor (44 mL).  Lifestyle  Do not use any products that contain nicotine or tobacco. These products include cigarettes, chewing tobacco, and vaping devices, such as e-cigarettes. If you need help quitting, ask your health care provider.  Do not use street drugs.  Do not share needles.  Ask your health care provider for help if you need support or information about quitting drugs.  General instructions  Schedule regular health, dental, and eye exams.  Stay current with your vaccines.  Tell your health care provider if:  You often feel depressed.  You have ever been abused or do not feel safe at home.  Summary  Adopting a healthy lifestyle and getting preventive care are important in promoting health and wellness.  Follow your health care provider's instructions about healthy diet, exercising, and getting tested or screened for diseases.  Follow your health care provider's instructions on monitoring your cholesterol and blood pressure.  This information is not intended to replace advice given to you by your health care provider. Make sure you discuss any questions you have with your health care provider.  Document Revised: 08/27/2020 Document Reviewed: 08/27/2020  Elsevier Patient Education  2024 [employer].

## 2024-05-18 NOTE — Progress Notes (Signed)
 "  Subjective   Patient ID: Steven Hancock, male    DOB: [birth year], 39 y.o.   MRN: [id number]  Chief Complaint  Patient presents with   Annual Exam    Concerns about getting a vasectomy.     Referring provider: Oley Bascom RAMAN, NP  Steven Hancock is a 39 y.o. male with No past medical history on file.   HPI  Patient presents today for an annual physical.  Overall states that everything is  going well for him.  He does stay active on his job with [employer].  Labs at last physical were overall normal.  We will recheck labs today. Denies f/c/s, n/v/d, hemoptysis, PND, leg swelling Denies chest pain or edema    Allergies[1]   There is no immunization history on file for this patient.  Tobacco History: Tobacco Use History[2] Counseling given: Not Answered   Outpatient Encounter Medications as of 05/18/2024  Medication Sig   Ibuprofen (ADVIL LIQUI-GELS MINIS PO) Take by mouth.   Multiple Vitamin (MULTIVITAMIN) tablet Take 1 tablet by mouth daily.   [DISCONTINUED] albuterol  (VENTOLIN  HFA) 108 (90 Base) MCG/ACT inhaler INHALE 2 PUFFS INTO THE LUNGS EVERY 6 HOURS AS NEEDED FOR WHEEZING OR SHORTNESS OF BREATH   albuterol  (VENTOLIN  HFA) 108 (90 Base) MCG/ACT inhaler Inhale 2 puffs into the lungs every 6 (six) hours as needed for wheezing or shortness of breath.   Salicylic Acid (GEL CALLUS REMOVERS) 40 % PADS Apply 1 Application topically every other day.   No facility-administered encounter medications on file as of 05/18/2024.    Review of Systems  Review of Systems  Constitutional: Negative.   HENT: Negative.    Cardiovascular: Negative.   Gastrointestinal: Negative.   Allergic/Immunologic: Negative.   Neurological: Negative.   Psychiatric/Behavioral: Negative.       Objective:   BP 125/87   Pulse 84   Temp 98.3 F (36.8 C) (Temporal)   Wt 219 lb (99.3 kg)   SpO2 98%   BMI 32.11 kg/m   Wt Readings from Last 5 Encounters:  05/18/24 219 lb (99.3 kg)   01/28/24 212 lb (96.2 kg)  05/18/23 217 lb 12.8 oz (98.8 kg)  05/16/22 221 lb 12.8 oz (100.6 kg)  05/16/21 214 lb 12.8 oz (97.4 kg)     Physical Exam Vitals and nursing note reviewed.  Constitutional:      General: He is not in acute distress.    Appearance: He is well-developed.  Cardiovascular:     Rate and Rhythm: Normal rate and regular rhythm.  Pulmonary:     Effort: Pulmonary effort is normal.     Breath sounds: Normal breath sounds.  Skin:    General: Skin is warm and dry.  Neurological:     Mental Status: He is alert and oriented to person, place, and time.       Assessment & Plan:   Routine adult health maintenance -     CBC -     Comprehensive metabolic panel with GFR  Mild intermittent asthma, unspecified whether complicated -     Albuterol  Sulfate HFA; Inhale 2 puffs into the lungs every 6 (six) hours as needed for wheezing or shortness of breath.  Dispense: 6.7 g; Refill: 1 -     CBC -     Comprehensive metabolic panel with GFR  Thyroid disorder screen -     TSH  Lipid screening -     Lipid panel  Vasectomy evaluation -     Ambulatory  referral to Urology     Return in about 1 year (around 05/18/2025) for Physical.    Bascom GORMAN Borer, NP 05/18/2024     [1] No Known Allergies [2]  Social History Tobacco Use  Smoking Status Never  Smokeless Tobacco Never   "

## 2024-05-19 ENCOUNTER — Ambulatory Visit: Payer: Self-pay | Admitting: Nurse Practitioner

## 2024-05-19 LAB — COMPREHENSIVE METABOLIC PANEL WITH GFR
ALT: 23 [IU]/L (ref 0–44)
AST: 26 [IU]/L (ref 0–40)
Albumin: 4.2 g/dL (ref 4.1–5.1)
Alkaline Phosphatase: 82 [IU]/L (ref 47–123)
BUN/Creatinine Ratio: 13 (ref 9–20)
BUN: 16 mg/dL (ref 6–20)
Bilirubin Total: 0.8 mg/dL (ref 0.0–1.2)
CO2: 26 mmol/L (ref 20–29)
Calcium: 9.2 mg/dL (ref 8.7–10.2)
Chloride: 100 mmol/L (ref 96–106)
Creatinine, Ser: 1.19 mg/dL (ref 0.76–1.27)
Globulin, Total: 2.1 g/dL (ref 1.5–4.5)
Glucose: 92 mg/dL (ref 70–99)
Potassium: 4.6 mmol/L (ref 3.5–5.2)
Sodium: 140 mmol/L (ref 134–144)
Total Protein: 6.3 g/dL (ref 6.0–8.5)
eGFR: 80 mL/min/{1.73_m2}

## 2024-05-19 LAB — LIPID PANEL
Chol/HDL Ratio: 2.6 ratio (ref 0.0–5.0)
Cholesterol, Total: 165 mg/dL (ref 100–199)
HDL: 64 mg/dL
LDL Chol Calc (NIH): 81 mg/dL (ref 0–99)
Triglycerides: 112 mg/dL (ref 0–149)
VLDL Cholesterol Cal: 20 mg/dL (ref 5–40)

## 2024-05-19 LAB — TSH: TSH: 2.14 u[IU]/mL (ref 0.450–4.500)

## 2024-05-19 LAB — CBC
Hematocrit: 42.1 % (ref 37.5–51.0)
Hemoglobin: 13.1 g/dL (ref 13.0–17.7)
MCH: 27.2 pg (ref 26.6–33.0)
MCHC: 31.1 g/dL — ABNORMAL LOW (ref 31.5–35.7)
MCV: 87 fL (ref 79–97)
Platelets: 269 10*3/uL (ref 150–450)
RBC: 4.82 x10E6/uL (ref 4.14–5.80)
RDW: 12.7 % (ref 11.6–15.4)
WBC: 6.1 10*3/uL (ref 3.4–10.8)

## 2025-05-19 ENCOUNTER — Encounter: Payer: Self-pay | Admitting: Nurse Practitioner
